# Patient Record
Sex: Female | Born: 1955 | Race: White | Hispanic: No | Marital: Married | Smoking: Never smoker
Health system: Southern US, Community
[De-identification: ages and names within clinical notes are randomized; demographics above are authoritative.]

## PROBLEM LIST (undated history)

## (undated) DIAGNOSIS — M1990 Unspecified osteoarthritis, unspecified site: Secondary | ICD-10-CM

## (undated) DIAGNOSIS — T8859XA Other complications of anesthesia, initial encounter: Secondary | ICD-10-CM

## (undated) DIAGNOSIS — I10 Essential (primary) hypertension: Secondary | ICD-10-CM

## (undated) HISTORY — PX: DILATION AND CURETTAGE OF UTERUS: SHX78

## (undated) HISTORY — PX: BREAST CYST EXCISION: SHX579

## (undated) HISTORY — PX: HERNIA REPAIR: SHX51

## (undated) HISTORY — PX: BREAST CYST ASPIRATION: SHX578

## (undated) HISTORY — PX: OTHER SURGICAL HISTORY: SHX169

---

## 2001-07-29 ENCOUNTER — Ambulatory Visit (HOSPITAL_COMMUNITY): Admission: RE | Admit: 2001-07-29 | Discharge: 2001-07-29 | Payer: Self-pay | Admitting: Sports Medicine

## 2001-07-29 ENCOUNTER — Encounter: Payer: Self-pay | Admitting: Sports Medicine

## 2005-05-21 ENCOUNTER — Encounter: Admission: RE | Admit: 2005-05-21 | Discharge: 2005-05-21 | Payer: Self-pay | Admitting: Otolaryngology

## 2006-07-16 ENCOUNTER — Ambulatory Visit: Payer: Self-pay | Admitting: Sports Medicine

## 2006-07-16 DIAGNOSIS — IMO0002 Reserved for concepts with insufficient information to code with codable children: Secondary | ICD-10-CM | POA: Insufficient documentation

## 2006-07-17 ENCOUNTER — Emergency Department (HOSPITAL_COMMUNITY): Admission: EM | Admit: 2006-07-17 | Discharge: 2006-07-17 | Payer: Self-pay | Admitting: Emergency Medicine

## 2006-11-12 ENCOUNTER — Ambulatory Visit: Payer: Self-pay | Admitting: Sports Medicine

## 2006-11-12 DIAGNOSIS — M79609 Pain in unspecified limb: Secondary | ICD-10-CM | POA: Insufficient documentation

## 2006-11-12 DIAGNOSIS — M21619 Bunion of unspecified foot: Secondary | ICD-10-CM | POA: Insufficient documentation

## 2010-12-22 ENCOUNTER — Ambulatory Visit
Admission: RE | Admit: 2010-12-22 | Discharge: 2010-12-22 | Disposition: A | Discharge status: Home / Self Care | Payer: BC Managed Care – PPO | Source: Ambulatory Visit | Attending: Family Medicine | Admitting: Family Medicine

## 2010-12-22 ENCOUNTER — Other Ambulatory Visit: Payer: Self-pay | Admitting: Family Medicine

## 2010-12-22 DIAGNOSIS — M545 Low back pain, unspecified: Secondary | ICD-10-CM

## 2011-11-14 ENCOUNTER — Other Ambulatory Visit: Payer: Self-pay | Admitting: Obstetrics and Gynecology

## 2011-11-14 DIAGNOSIS — R928 Other abnormal and inconclusive findings on diagnostic imaging of breast: Secondary | ICD-10-CM

## 2011-11-21 ENCOUNTER — Other Ambulatory Visit: Payer: BC Managed Care – PPO

## 2011-11-30 ENCOUNTER — Other Ambulatory Visit: Payer: BC Managed Care – PPO

## 2011-11-30 ENCOUNTER — Other Ambulatory Visit: Payer: Self-pay | Admitting: Obstetrics and Gynecology

## 2011-11-30 ENCOUNTER — Ambulatory Visit
Admission: RE | Admit: 2011-11-30 | Discharge: 2011-11-30 | Disposition: A | Discharge status: Home / Self Care | Payer: BC Managed Care – PPO | Source: Ambulatory Visit | Attending: Obstetrics and Gynecology | Admitting: Obstetrics and Gynecology

## 2011-11-30 DIAGNOSIS — R928 Other abnormal and inconclusive findings on diagnostic imaging of breast: Secondary | ICD-10-CM

## 2011-11-30 DIAGNOSIS — N6310 Unspecified lump in the right breast, unspecified quadrant: Secondary | ICD-10-CM

## 2011-12-07 ENCOUNTER — Ambulatory Visit
Admission: RE | Admit: 2011-12-07 | Discharge: 2011-12-07 | Disposition: A | Discharge status: Home / Self Care | Payer: BC Managed Care – PPO | Source: Ambulatory Visit | Attending: Obstetrics and Gynecology | Admitting: Obstetrics and Gynecology

## 2011-12-07 DIAGNOSIS — N6310 Unspecified lump in the right breast, unspecified quadrant: Secondary | ICD-10-CM

## 2011-12-14 ENCOUNTER — Other Ambulatory Visit: Payer: Self-pay | Admitting: Obstetrics and Gynecology

## 2011-12-14 DIAGNOSIS — Z803 Family history of malignant neoplasm of breast: Secondary | ICD-10-CM

## 2011-12-24 ENCOUNTER — Other Ambulatory Visit: Payer: BC Managed Care – PPO

## 2012-02-08 ENCOUNTER — Other Ambulatory Visit: Payer: Self-pay | Admitting: Family Medicine

## 2012-02-08 ENCOUNTER — Ambulatory Visit
Admission: RE | Admit: 2012-02-08 | Discharge: 2012-02-08 | Disposition: A | Discharge status: Home / Self Care | Payer: BC Managed Care – PPO | Source: Ambulatory Visit | Attending: Family Medicine | Admitting: Family Medicine

## 2012-02-08 DIAGNOSIS — R05 Cough: Secondary | ICD-10-CM

## 2012-02-08 DIAGNOSIS — R5383 Other fatigue: Secondary | ICD-10-CM

## 2012-02-08 DIAGNOSIS — R059 Cough, unspecified: Secondary | ICD-10-CM

## 2012-09-09 ENCOUNTER — Other Ambulatory Visit: Payer: Self-pay | Admitting: Obstetrics and Gynecology

## 2012-09-09 DIAGNOSIS — Z803 Family history of malignant neoplasm of breast: Secondary | ICD-10-CM

## 2012-09-09 DIAGNOSIS — N6310 Unspecified lump in the right breast, unspecified quadrant: Secondary | ICD-10-CM

## 2012-09-23 ENCOUNTER — Ambulatory Visit
Admission: RE | Admit: 2012-09-23 | Discharge: 2012-09-23 | Disposition: A | Discharge status: Home / Self Care | Payer: BC Managed Care – PPO | Source: Ambulatory Visit | Attending: Obstetrics and Gynecology | Admitting: Obstetrics and Gynecology

## 2012-09-23 ENCOUNTER — Other Ambulatory Visit: Payer: Self-pay | Admitting: Obstetrics and Gynecology

## 2012-09-23 DIAGNOSIS — N6310 Unspecified lump in the right breast, unspecified quadrant: Secondary | ICD-10-CM

## 2012-12-15 ENCOUNTER — Other Ambulatory Visit: Payer: Self-pay | Admitting: Obstetrics and Gynecology

## 2012-12-15 DIAGNOSIS — N6489 Other specified disorders of breast: Secondary | ICD-10-CM

## 2013-01-06 ENCOUNTER — Ambulatory Visit
Admission: RE | Admit: 2013-01-06 | Discharge: 2013-01-06 | Disposition: A | Discharge status: Home / Self Care | Payer: BC Managed Care – PPO | Source: Ambulatory Visit | Attending: Obstetrics and Gynecology | Admitting: Obstetrics and Gynecology

## 2013-01-06 DIAGNOSIS — N6489 Other specified disorders of breast: Secondary | ICD-10-CM

## 2013-01-06 DIAGNOSIS — Z803 Family history of malignant neoplasm of breast: Secondary | ICD-10-CM

## 2013-01-06 MED ORDER — GADOBENATE DIMEGLUMINE 529 MG/ML IV SOLN
15.0000 mL | Freq: Once | INTRAVENOUS | Status: AC | PRN
  Administered 2013-01-06: 15 mL via INTRAVENOUS

## 2014-05-27 ENCOUNTER — Other Ambulatory Visit: Payer: Self-pay | Admitting: Obstetrics and Gynecology

## 2014-05-27 DIAGNOSIS — N6310 Unspecified lump in the right breast, unspecified quadrant: Secondary | ICD-10-CM

## 2014-06-01 ENCOUNTER — Ambulatory Visit
Admission: RE | Admit: 2014-06-01 | Discharge: 2014-06-01 | Disposition: A | Discharge status: Home / Self Care | Payer: BLUE CROSS/BLUE SHIELD | Source: Ambulatory Visit | Attending: Obstetrics and Gynecology | Admitting: Obstetrics and Gynecology

## 2014-06-01 DIAGNOSIS — N6310 Unspecified lump in the right breast, unspecified quadrant: Secondary | ICD-10-CM

## 2014-06-08 ENCOUNTER — Encounter: Payer: Self-pay | Admitting: Podiatry

## 2014-06-11 ENCOUNTER — Ambulatory Visit (INDEPENDENT_AMBULATORY_CARE_PROVIDER_SITE_OTHER): Payer: BLUE CROSS/BLUE SHIELD

## 2014-06-11 ENCOUNTER — Ambulatory Visit (INDEPENDENT_AMBULATORY_CARE_PROVIDER_SITE_OTHER): Payer: BLUE CROSS/BLUE SHIELD | Admitting: Podiatry

## 2014-06-11 DIAGNOSIS — M25571 Pain in right ankle and joints of right foot: Secondary | ICD-10-CM

## 2014-06-11 DIAGNOSIS — M25471 Effusion, right ankle: Secondary | ICD-10-CM

## 2014-06-11 DIAGNOSIS — M79671 Pain in right foot: Secondary | ICD-10-CM

## 2014-06-11 NOTE — Progress Notes (Signed)
   Subjective:    Patient ID: Kathleen Meyers, female    DOB: [birth year], 59 y.o.   MRN: [id number]  HPI 59 year old female presents to the office today with complaints of right ankle swelling which has been ongoing for over 1 week. She denies any pain associated with the swelling or any redness. She denies any history of injury or trauma. She has no pain with ambulation. She does state that couple weeks has she had some bruising to her left foot as well as to her breast with no appear to cause. She did see her primary care physician a time for this. She has been wearing the ankle brace intermittently to the right ankle as well as a compression sock for swelling. No other complaints at this time. She has recently started taking lisinopril.   Review of Systems  All other systems reviewed and are negative.      Objective:   Physical Exam AAO x3, NAD DP/PT pulses palpable bilaterally, CRT less than 3 seconds Protective sensation intact with Simms Weinstein monofilament, vibratory sensation intact, Achilles tendon reflex intact There is edema overlying the dorsal lateral aspect of the right ankle. There is mild tenderness to palpation over the course the ATFL however there is no tenderness with CFL or PTFL. There is no areas of pinpoint bony tenderness or pain the vibratory sensation over the fibula, tibia, foot. There is no associated erythema or increase in warmth over the swelling. No pain on the syndesmosis of the peroneal tendons. Negative anterior drawer and talar tilt test.  No other areas of tenderness to bilateral lower extremities. MMT 5/5, ROM WNL.  No open lesions or pre-ulcerative lesions.  No overlying edema, erythema, increase in warmth to bilateral lower extremities.  No pain with calf compression, swelling, warmth, erythema bilaterally.      Assessment & Plan:  59 year old female with right ankle swelling -X-rays were obtained and reviewed with the patient. -Treatment  options were discussed including alternatives, risks, complications. -Recommended continued ankle brace and/or compression anklet to help with swelling. -Recommended her to follow-up with her primary care physician as well as she's had increased symptoms after starting new blood pressure medication. -If she is able to tolerate anti-inflammatories she can continue with this. -Follow-up if symptoms are not resolved within 4 weeks or sooner if any problems are to arise. In the meantime call the office with any questions, concerns, change in symptoms. -

## 2014-07-22 ENCOUNTER — Encounter (HOSPITAL_COMMUNITY): Payer: Self-pay

## 2014-07-22 ENCOUNTER — Encounter (HOSPITAL_COMMUNITY)
Admission: RE | Admit: 2014-07-22 | Discharge: 2014-07-22 | Disposition: A | Discharge status: Home / Self Care | Payer: BLUE CROSS/BLUE SHIELD | Source: Ambulatory Visit | Attending: Obstetrics and Gynecology | Admitting: Obstetrics and Gynecology

## 2014-07-22 ENCOUNTER — Other Ambulatory Visit: Payer: Self-pay

## 2014-07-22 DIAGNOSIS — Z01818 Encounter for other preprocedural examination: Secondary | ICD-10-CM | POA: Insufficient documentation

## 2014-07-22 LAB — CBC
HEMATOCRIT: 38.6 % (ref 36.0–46.0)
HEMOGLOBIN: 13.4 g/dL (ref 12.0–15.0)
MCH: 28.4 pg (ref 26.0–34.0)
MCHC: 34.7 g/dL (ref 30.0–36.0)
MCV: 81.8 fL (ref 78.0–100.0)
PLATELETS: 238 10*3/uL (ref 150–400)
RBC: 4.72 MIL/uL (ref 3.87–5.11)
RDW: 12.4 % (ref 11.5–15.5)
WBC: 7.9 10*3/uL (ref 4.0–10.5)

## 2014-07-22 LAB — BASIC METABOLIC PANEL
Anion gap: 5 (ref 5–15)
BUN: 25 mg/dL — ABNORMAL HIGH (ref 6–20)
CHLORIDE: 106 mmol/L (ref 101–111)
CO2: 26 mmol/L (ref 22–32)
Calcium: 9.4 mg/dL (ref 8.9–10.3)
Creatinine, Ser: 0.75 mg/dL (ref 0.44–1.00)
GFR calc non Af Amer: >60 mL/min (ref >60)
GLUCOSE: 98 mg/dL (ref 65–99)
POTASSIUM: 3.9 mmol/L (ref 3.5–5.1)
Sodium: 137 mmol/L (ref 135–145)

## 2014-07-22 NOTE — Patient Instructions (Signed)
Your procedure is scheduled on:  August 05, 2014  Enter through the Main Entrance of [facility] at:  0600 am   Pick up the phone at the desk and dial [phone].  Call this number if you have problems the morning of surgery: [phone].  Remember: Do NOT eat food: after midnight on August 04, 2014 Do NOT drink clear liquids after:  After midnight on August 04, 2014 Take these medicines the morning of surgery with a SIP OF WATER:  None   Do NOT wear jewelry (body piercing), metal hair clips/bobby pins, make-up, or nail polish. Do NOT wear lotions, powders, or perfumes.  You may wear deoderant. Do NOT shave for 48 hours prior to surgery. Do NOT bring valuables to the hospital. Contacts, dentures, or bridgework may not be worn into surgery. Leave suitcase in car.  After surgery it may be brought to your room.  For patients admitted to the hospital, checkout time is 11:00 AM the day of discharge.

## 2014-07-26 NOTE — H&P (Addendum)
Kathleen Meyers is an 59 y.o. female with enlarging fibroids.  Pt with occasional back pain.  Pt sister BRCA +.  No PMB     Menstrual History: No LMP recorded. Patient is postmenopausal.    No past medical history on file.  Past Surgical History  Procedure Laterality Date  . Ent surgery as a child     . Infertility surgery     . Dilation and curettage of uterus    . Hernia repair      umbilical as a child     No family history on file.  Social History:  reports that she has never smoked. She has never used smokeless tobacco. She reports that she does not drink alcohol or use illicit drugs.  Allergies: No Known Allergies  No prescriptions prior to admission    ROS  AF, VSS  Physical Exam  Gen - NAD Abd - soft, NT/ND CV - RRR Lungs - clear Ext - NT PV - uterus 8 wk, NT.  No palpable adnexal masses  [place]:  Multiple fibroids, largest 4.5cm.  1.6cm ovarian cyst w/ small hypoechoic area.  No free fluid  Assessment/Plan: Enlarging fibroids, pt sister w/ BRCA + LAVH/BSO, possible TAH R/b/a discussed, questions answered, informed consent  [name] 07/26/2014, 1:12 PM

## 2014-07-29 NOTE — Anesthesia Preprocedure Evaluation (Addendum)
Anesthesia Evaluation  Patient identified by MRN, date of birth, ID band Patient awake    Reviewed: Allergy & Precautions, NPO status , Patient's Chart, lab work & pertinent test results, reviewed documented beta blocker date and time   Airway Mallampati: II   Neck ROM: Full    Dental  (+) Teeth Intact, Dental Advisory Given   Pulmonary neg pulmonary ROS,  breath sounds clear to auscultation        Cardiovascular negative cardio ROS  Rhythm:Regular     Neuro/Psych negative neurological ROS  negative psych ROS   GI/Hepatic negative GI ROS, Neg liver ROS,   Endo/Other  negative endocrine ROS  Renal/GU negative Renal ROS   Fibroids    Musculoskeletal   Abdominal (+)  Abdomen: soft.    Peds  Hematology 13/38   Anesthesia Other Findings   Reproductive/Obstetrics                            Anesthesia Physical Anesthesia Plan  ASA: I  Anesthesia Plan: General   Post-op Pain Management:    Induction: Intravenous  Airway Management Planned: Oral ETT  Additional Equipment:   Intra-op Plan:   Post-operative Plan: Extubation in OR  Informed Consent: I have reviewed the patients History and Physical, chart, labs and discussed the procedure including the risks, benefits and alternatives for the proposed anesthesia with the patient or authorized representative who has indicated his/her understanding and acceptance.     Plan Discussed with:   Anesthesia Plan Comments:         Anesthesia Quick Evaluation

## 2014-08-04 MED ORDER — DEXTROSE 5 % IV SOLN
2.0000 g | INTRAVENOUS | Status: AC
  Administered 2014-08-05: 2 g via INTRAVENOUS
  Filled 2014-08-04: qty 2

## 2014-08-05 ENCOUNTER — Observation Stay (HOSPITAL_COMMUNITY)
Admission: RE | Admit: 2014-08-05 | Discharge: 2014-08-06 | Disposition: A | Discharge status: Home / Self Care | Payer: BLUE CROSS/BLUE SHIELD | Source: Ambulatory Visit | Attending: Obstetrics and Gynecology | Admitting: Obstetrics and Gynecology

## 2014-08-05 ENCOUNTER — Ambulatory Visit (HOSPITAL_COMMUNITY): Payer: BLUE CROSS/BLUE SHIELD | Admitting: Anesthesiology

## 2014-08-05 ENCOUNTER — Encounter (HOSPITAL_COMMUNITY): Payer: Self-pay

## 2014-08-05 ENCOUNTER — Encounter (HOSPITAL_COMMUNITY)
Admission: RE | Disposition: A | Discharge status: Home / Self Care | Payer: Self-pay | Source: Ambulatory Visit | Attending: Obstetrics and Gynecology

## 2014-08-05 DIAGNOSIS — D219 Benign neoplasm of connective and other soft tissue, unspecified: Secondary | ICD-10-CM | POA: Diagnosis present

## 2014-08-05 DIAGNOSIS — N8320 Unspecified ovarian cysts: Secondary | ICD-10-CM | POA: Insufficient documentation

## 2014-08-05 DIAGNOSIS — N840 Polyp of corpus uteri: Secondary | ICD-10-CM | POA: Diagnosis not present

## 2014-08-05 DIAGNOSIS — D259 Leiomyoma of uterus, unspecified: Principal | ICD-10-CM | POA: Insufficient documentation

## 2014-08-05 HISTORY — PX: LAPAROSCOPIC ASSISTED VAGINAL HYSTERECTOMY: SHX5398

## 2014-08-05 HISTORY — PX: SALPINGOOPHORECTOMY: SHX82

## 2014-08-05 LAB — TYPE AND SCREEN
ABO/RH(D): O POS
Antibody Screen: NEGATIVE

## 2014-08-05 LAB — ABO/RH: ABO/RH(D): O POS

## 2014-08-05 SURGERY — HYSTERECTOMY, VAGINAL, LAPAROSCOPY-ASSISTED
Anesthesia: General

## 2014-08-05 MED ORDER — FENTANYL CITRATE (PF) 100 MCG/2ML IJ SOLN
INTRAMUSCULAR | Status: DC | PRN
  Administered 2014-08-05: 50 ug via INTRAVENOUS
  Administered 2014-08-05 (×2): 100 ug via INTRAVENOUS
  Administered 2014-08-05 (×2): 50 ug via INTRAVENOUS

## 2014-08-05 MED ORDER — MIDAZOLAM HCL 2 MG/2ML IJ SOLN
INTRAMUSCULAR | Status: AC
  Filled 2014-08-05: qty 2

## 2014-08-05 MED ORDER — BUPIVACAINE HCL (PF) 0.25 % IJ SOLN
INTRAMUSCULAR | Status: DC | PRN
Start: 2014-08-05 — End: 2014-08-05
  Administered 2014-08-05: 5 mL

## 2014-08-05 MED ORDER — LACTATED RINGERS IV SOLN
INTRAVENOUS | Status: DC
  Administered 2014-08-05: 08:00:00 via INTRAVENOUS

## 2014-08-05 MED ORDER — NALOXONE HCL 0.4 MG/ML IJ SOLN: 0.4000 mg | INTRAMUSCULAR | Status: DC | PRN

## 2014-08-05 MED ORDER — SODIUM CHLORIDE 0.9 % IJ SOLN
9.0000 mL | INTRAMUSCULAR | Status: DC | PRN
Start: 2014-08-05 — End: 2014-08-06

## 2014-08-05 MED ORDER — HYDROMORPHONE HCL 1 MG/ML IJ SOLN
INTRAMUSCULAR | Status: AC
  Administered 2014-08-05: 0.5 mg via INTRAVENOUS
  Filled 2014-08-05: qty 1

## 2014-08-05 MED ORDER — DIPHENHYDRAMINE HCL 50 MG/ML IJ SOLN: 12.5000 mg | Freq: Four times a day (QID) | INTRAMUSCULAR | Status: DC | PRN

## 2014-08-05 MED ORDER — MENTHOL 3 MG MT LOZG: 1.0000 | LOZENGE | OROMUCOSAL | Status: DC | PRN

## 2014-08-05 MED ORDER — KETOROLAC TROMETHAMINE 30 MG/ML IJ SOLN
30.0000 mg | Freq: Three times a day (TID) | INTRAMUSCULAR | Status: DC
  Administered 2014-08-05 – 2014-08-06 (×3): 30 mg via INTRAVENOUS
  Filled 2014-08-05 (×3): qty 1

## 2014-08-05 MED ORDER — PROMETHAZINE HCL 25 MG/ML IJ SOLN: 6.2500 mg | INTRAMUSCULAR | Status: DC | PRN

## 2014-08-05 MED ORDER — ONDANSETRON HCL 4 MG/2ML IJ SOLN
INTRAMUSCULAR | Status: DC | PRN
  Administered 2014-08-05: 4 mg via INTRAVENOUS

## 2014-08-05 MED ORDER — GLYCOPYRROLATE 0.2 MG/ML IJ SOLN
INTRAMUSCULAR | Status: AC
  Filled 2014-08-05: qty 3

## 2014-08-05 MED ORDER — SCOPOLAMINE 1 MG/3DAYS TD PT72
MEDICATED_PATCH | TRANSDERMAL | Status: AC
  Filled 2014-08-05: qty 1

## 2014-08-05 MED ORDER — LIDOCAINE HCL (CARDIAC) 20 MG/ML IV SOLN
INTRAVENOUS | Status: DC | PRN
  Administered 2014-08-05: 100 mg via INTRAVENOUS

## 2014-08-05 MED ORDER — GLYCOPYRROLATE 0.2 MG/ML IJ SOLN
INTRAMUSCULAR | Status: DC | PRN
  Administered 2014-08-05: 0.6 mg via INTRAVENOUS

## 2014-08-05 MED ORDER — ONDANSETRON HCL 4 MG PO TABS: 4.0000 mg | ORAL_TABLET | Freq: Four times a day (QID) | ORAL | Status: DC | PRN

## 2014-08-05 MED ORDER — NEOSTIGMINE METHYLSULFATE 10 MG/10ML IV SOLN
INTRAVENOUS | Status: DC | PRN
  Administered 2014-08-05: 3 mg via INTRAVENOUS

## 2014-08-05 MED ORDER — KETOROLAC TROMETHAMINE 30 MG/ML IJ SOLN
INTRAMUSCULAR | Status: DC | PRN
  Administered 2014-08-05: 30 mg via INTRAVENOUS

## 2014-08-05 MED ORDER — MORPHINE SULFATE 1 MG/ML IV SOLN
INTRAVENOUS | Status: DC
  Administered 2014-08-05: 13:00:00 via INTRAVENOUS
  Administered 2014-08-05: 14 mg via INTRAVENOUS
  Administered 2014-08-06 (×2): 3 mg via INTRAVENOUS
  Filled 2014-08-05: qty 25

## 2014-08-05 MED ORDER — FENTANYL CITRATE (PF) 100 MCG/2ML IJ SOLN
INTRAMUSCULAR | Status: AC
  Administered 2014-08-05: 25 ug via INTRAVENOUS
  Filled 2014-08-05: qty 2

## 2014-08-05 MED ORDER — ONDANSETRON HCL 4 MG/2ML IJ SOLN: 4.0000 mg | Freq: Four times a day (QID) | INTRAMUSCULAR | Status: DC | PRN

## 2014-08-05 MED ORDER — MIDAZOLAM HCL 2 MG/2ML IJ SOLN
INTRAMUSCULAR | Status: DC | PRN
  Administered 2014-08-05: 2 mg via INTRAVENOUS

## 2014-08-05 MED ORDER — ATORVASTATIN CALCIUM 20 MG PO TABS
20.0000 mg | ORAL_TABLET | Freq: Every day | ORAL | Status: DC
  Filled 2014-08-05 (×2): qty 1

## 2014-08-05 MED ORDER — BUPIVACAINE HCL (PF) 0.25 % IJ SOLN
INTRAMUSCULAR | Status: AC
  Filled 2014-08-05: qty 30

## 2014-08-05 MED ORDER — HYDROMORPHONE HCL 1 MG/ML IJ SOLN
0.5000 mg | INTRAMUSCULAR | Status: AC | PRN
  Administered 2014-08-05 (×4): 0.5 mg via INTRAVENOUS

## 2014-08-05 MED ORDER — OXYCODONE-ACETAMINOPHEN 5-325 MG PO TABS
1.0000 | ORAL_TABLET | ORAL | Status: DC | PRN
  Administered 2014-08-06: 1 via ORAL
  Filled 2014-08-05: qty 2

## 2014-08-05 MED ORDER — KETOROLAC TROMETHAMINE 30 MG/ML IJ SOLN
INTRAMUSCULAR | Status: AC
  Administered 2014-08-05: 30 mg via INTRAMUSCULAR
  Filled 2014-08-05: qty 1

## 2014-08-05 MED ORDER — FENTANYL CITRATE (PF) 100 MCG/2ML IJ SOLN
25.0000 ug | INTRAMUSCULAR | Status: DC | PRN
  Administered 2014-08-05: 25 ug via INTRAVENOUS

## 2014-08-05 MED ORDER — PROPOFOL 10 MG/ML IV BOLUS
INTRAVENOUS | Status: AC
  Filled 2014-08-05: qty 20

## 2014-08-05 MED ORDER — MEPERIDINE HCL 25 MG/ML IJ SOLN: 6.2500 mg | INTRAMUSCULAR | Status: DC | PRN

## 2014-08-05 MED ORDER — AMLODIPINE BESYLATE 5 MG PO TABS
5.0000 mg | ORAL_TABLET | Freq: Every day | ORAL | Status: DC
  Filled 2014-08-05 (×2): qty 1

## 2014-08-05 MED ORDER — ROCURONIUM BROMIDE 100 MG/10ML IV SOLN
INTRAVENOUS | Status: AC
  Filled 2014-08-05: qty 1

## 2014-08-05 MED ORDER — HYDROMORPHONE HCL 1 MG/ML IJ SOLN
INTRAMUSCULAR | Status: DC | PRN
  Administered 2014-08-05: 1 mg via INTRAVENOUS

## 2014-08-05 MED ORDER — FENTANYL CITRATE (PF) 250 MCG/5ML IJ SOLN
INTRAMUSCULAR | Status: AC
  Filled 2014-08-05: qty 5

## 2014-08-05 MED ORDER — HYDROMORPHONE HCL 1 MG/ML IJ SOLN
INTRAMUSCULAR | Status: AC
  Filled 2014-08-05: qty 1

## 2014-08-05 MED ORDER — 0.9 % SODIUM CHLORIDE (POUR BTL) OPTIME
TOPICAL | Status: DC | PRN
  Administered 2014-08-05: 1000 mL

## 2014-08-05 MED ORDER — PROPOFOL 10 MG/ML IV BOLUS
INTRAVENOUS | Status: DC | PRN
  Administered 2014-08-05: 200 mg via INTRAVENOUS

## 2014-08-05 MED ORDER — DIPHENHYDRAMINE HCL 12.5 MG/5ML PO ELIX: 12.5000 mg | ORAL_SOLUTION | Freq: Four times a day (QID) | ORAL | Status: DC | PRN

## 2014-08-05 MED ORDER — NEOSTIGMINE METHYLSULFATE 10 MG/10ML IV SOLN
INTRAVENOUS | Status: AC
  Filled 2014-08-05: qty 1

## 2014-08-05 MED ORDER — ONDANSETRON HCL 4 MG/2ML IJ SOLN
INTRAMUSCULAR | Status: AC
  Filled 2014-08-05: qty 2

## 2014-08-05 MED ORDER — DEXAMETHASONE SODIUM PHOSPHATE 4 MG/ML IJ SOLN
INTRAMUSCULAR | Status: AC
  Filled 2014-08-05: qty 1

## 2014-08-05 MED ORDER — HYDROMORPHONE HCL 1 MG/ML IJ SOLN
1.0000 mg | INTRAMUSCULAR | Status: DC | PRN
  Administered 2014-08-05: 1 mg via INTRAVENOUS
  Filled 2014-08-05: qty 1

## 2014-08-05 MED ORDER — SCOPOLAMINE 1 MG/3DAYS TD PT72: 1.0000 | MEDICATED_PATCH | Freq: Once | TRANSDERMAL | Status: DC

## 2014-08-05 MED ORDER — ROCURONIUM BROMIDE 100 MG/10ML IV SOLN
INTRAVENOUS | Status: DC | PRN
  Administered 2014-08-05: 10 mg via INTRAVENOUS
  Administered 2014-08-05: 50 mg via INTRAVENOUS

## 2014-08-05 MED ORDER — DEXAMETHASONE SODIUM PHOSPHATE 10 MG/ML IJ SOLN
INTRAMUSCULAR | Status: DC | PRN
  Administered 2014-08-05: 4 mg via INTRAVENOUS

## 2014-08-05 MED ORDER — IBUPROFEN 600 MG PO TABS
600.0000 mg | ORAL_TABLET | Freq: Four times a day (QID) | ORAL | Status: DC | PRN
  Administered 2014-08-06: 600 mg via ORAL
  Filled 2014-08-05: qty 1

## 2014-08-05 MED ORDER — DEXTROSE IN LACTATED RINGERS 5 % IV SOLN
INTRAVENOUS | Status: DC
  Administered 2014-08-05: 21:00:00 via INTRAVENOUS

## 2014-08-05 MED ORDER — KETOROLAC TROMETHAMINE 30 MG/ML IJ SOLN
INTRAMUSCULAR | Status: AC
  Filled 2014-08-05: qty 1

## 2014-08-05 MED ORDER — LIDOCAINE HCL (CARDIAC) 20 MG/ML IV SOLN
INTRAVENOUS | Status: AC
  Filled 2014-08-05: qty 5

## 2014-08-05 MED ORDER — ACETAMINOPHEN 10 MG/ML IV SOLN
1000.0000 mg | Freq: Once | INTRAVENOUS | Status: AC
Start: 2014-08-05 — End: 2014-08-05
  Administered 2014-08-05: 1000 mg via INTRAVENOUS
  Filled 2014-08-05: qty 100

## 2014-08-05 MED ORDER — ONDANSETRON HCL 4 MG/2ML IJ SOLN
4.0000 mg | Freq: Four times a day (QID) | INTRAMUSCULAR | Status: DC | PRN
Start: 2014-08-05 — End: 2014-08-06

## 2014-08-05 MED ORDER — KETOROLAC TROMETHAMINE 30 MG/ML IJ SOLN
30.0000 mg | Freq: Once | INTRAMUSCULAR | Status: AC
  Administered 2014-08-05: 30 mg via INTRAMUSCULAR

## 2014-08-05 SURGICAL SUPPLY — 56 items
CABLE HIGH FREQUENCY MONO STRZ (ELECTRODE) IMPLANT
CANISTER SUCT 3000ML (MISCELLANEOUS) ×3 IMPLANT
CLOTH BEACON ORANGE TIMEOUT ST (SAFETY) ×3 IMPLANT
CONT PATH 16OZ SNAP LID 3702 (MISCELLANEOUS) ×3 IMPLANT
COVER BACK TABLE 60X90IN (DRAPES) ×3 IMPLANT
DECANTER SPIKE VIAL GLASS SM (MISCELLANEOUS) IMPLANT
DRAPE CESAREAN BIRTH W POUCH (DRAPES) ×3 IMPLANT
DRAPE WARM FLUID 44X44 (DRAPE) IMPLANT
DRSG COVADERM PLUS 2X2 (GAUZE/BANDAGES/DRESSINGS) ×6 IMPLANT
DRSG OPSITE POSTOP 3X4 (GAUZE/BANDAGES/DRESSINGS) IMPLANT
DRSG OPSITE POSTOP 4X10 (GAUZE/BANDAGES/DRESSINGS) ×3 IMPLANT
DURAPREP 26ML APPLICATOR (WOUND CARE) ×3 IMPLANT
ELECT LIGASURE LONG (ELECTRODE) IMPLANT
ELECT LIGASURE SHORT 9 REUSE (ELECTRODE) IMPLANT
ELECT REM PT RETURN 9FT ADLT (ELECTROSURGICAL)
ELECTRODE REM PT RTRN 9FT ADLT (ELECTROSURGICAL) IMPLANT
GAUZE SPONGE 4X4 16PLY XRAY LF (GAUZE/BANDAGES/DRESSINGS) IMPLANT
GLOVE BIO SURGEON STRL SZ 6.5 (GLOVE) ×3 IMPLANT
GLOVE BIOGEL PI IND STRL 6.5 (GLOVE) ×2 IMPLANT
GLOVE BIOGEL PI IND STRL 7.0 (GLOVE) ×6 IMPLANT
GLOVE BIOGEL PI INDICATOR 6.5 (GLOVE) ×1
GLOVE BIOGEL PI INDICATOR 7.0 (GLOVE) ×3
GOWN STRL REUS W/TWL LRG LVL3 (GOWN DISPOSABLE) ×9 IMPLANT
HEMOSTAT SURGICEL 4X8 (HEMOSTASIS) IMPLANT
LIQUID BAND (GAUZE/BANDAGES/DRESSINGS) ×6 IMPLANT
NS IRRIG 1000ML POUR BTL (IV SOLUTION) ×3 IMPLANT
PACK ABDOMINAL GYN (CUSTOM PROCEDURE TRAY) ×3 IMPLANT
PACK LAVH (CUSTOM PROCEDURE TRAY) ×3 IMPLANT
PACK ROBOTIC GOWN (GOWN DISPOSABLE) ×3 IMPLANT
PAD OB MATERNITY 4.3X12.25 (PERSONAL CARE ITEMS) ×3 IMPLANT
PAD POSITIONER PINK NONSTERILE (MISCELLANEOUS) ×3 IMPLANT
PROTECTOR NERVE ULNAR (MISCELLANEOUS) ×3 IMPLANT
SEALER TISSUE G2 CVD JAW 45CM (ENDOMECHANICALS) ×3 IMPLANT
SET IRRIG TUBING LAPAROSCOPIC (IRRIGATION / IRRIGATOR) IMPLANT
SLEEVE XCEL OPT CAN 5 100 (ENDOMECHANICALS) IMPLANT
SPONGE LAP 18X18 X RAY DECT (DISPOSABLE) ×6 IMPLANT
STAPLER VISISTAT 35W (STAPLE) IMPLANT
SUT MNCRL 0 MO-4 VIOLET 18 CR (SUTURE) ×6 IMPLANT
SUT MNCRL 0 VIOLET 6X18 (SUTURE) ×2 IMPLANT
SUT MON AB 2-0 CT1 36 (SUTURE) ×3 IMPLANT
SUT MON AB-0 CT1 36 (SUTURE) ×3 IMPLANT
SUT MONOCRYL 0 6X18 (SUTURE) ×1
SUT MONOCRYL 0 MO 4 18  CR/8 (SUTURE) ×3
SUT PDS AB 0 CTX 60 (SUTURE) ×3 IMPLANT
SUT PLAIN 2 0 XLH (SUTURE) IMPLANT
SUT VIC AB 3-0 PS2 18 (SUTURE) ×1
SUT VIC AB 3-0 PS2 18XBRD (SUTURE) ×2 IMPLANT
SUT VIC AB 3-0 X1 27 (SUTURE) IMPLANT
SUT VIC AB 4-0 KS 27 (SUTURE) IMPLANT
SUT VICRYL 0 UR6 27IN ABS (SUTURE) ×3 IMPLANT
TOWEL OR 17X24 6PK STRL BLUE (TOWEL DISPOSABLE) ×6 IMPLANT
TRAY FOLEY CATH SILVER 14FR (SET/KITS/TRAYS/PACK) ×3 IMPLANT
TROCAR OPTI TIP 5M 100M (ENDOMECHANICALS) ×3 IMPLANT
TROCAR XCEL NON-BLD 11X100MML (ENDOMECHANICALS) ×3 IMPLANT
WARMER LAPAROSCOPE (MISCELLANEOUS) ×3 IMPLANT
WATER STERILE IRR 1000ML POUR (IV SOLUTION) ×3 IMPLANT

## 2014-08-05 NOTE — Anesthesia Postprocedure Evaluation (Signed)
  Anesthesia Post-op Note  Patient: Kathleen Meyers  Procedure(s) Performed: Procedure(s): LAPAROSCOPIC ASSISTED VAGINAL HYSTERECTOMY (N/A) BILATERAL SALPINGO OOPHORECTOMY (Bilateral)  Patient Location: [facility]  Anesthesia Type:General  Level of Consciousness: awake, alert  and oriented  Airway and Oxygen Therapy: Patient Spontanous Breathing and Patient connected to nasal cannula oxygen  Post-op Pain: none  Post-op Assessment: Post-op Vital signs reviewed, Patient's Cardiovascular Status Stable, Respiratory Function Stable, Adequate PO intake and Pain level controlled              Post-op Vital Signs: Reviewed and stable  Last Vitals:  Filed Vitals:   08/05/14 1430  BP: 123/79  Pulse: 61  Temp:   Resp: 9    Complications: No apparent anesthesia complications

## 2014-08-05 NOTE — Transfer of Care (Signed)
Immediate Anesthesia Transfer of Care Note  Patient: Kathleen Meyers  Procedure(s) Performed: Procedure(s): LAPAROSCOPIC ASSISTED VAGINAL HYSTERECTOMY (N/A) BILATERAL SALPINGO OOPHORECTOMY (Bilateral)  Patient Location: PACU  Anesthesia Type:General  Level of Consciousness: awake, alert  and oriented  Airway & Oxygen Therapy: Patient Spontanous Breathing and Patient connected to nasal cannula oxygen  Post-op Assessment: Report given to RN, Post -op Vital signs reviewed and stable and Patient moving all extremities  Post vital signs: Reviewed and stable  Last Vitals:  Filed Vitals:   08/05/14 0617  BP: 114/81  Pulse: 71  Temp: 36.9 C  Resp: 16    Complications: No apparent anesthesia complications

## 2014-08-05 NOTE — Addendum Note (Signed)
Addendum  created 08/05/14 1549 by Jonna Munro, CRNA   Modules edited: Notes Section   Notes Section:  File: [id number]

## 2014-08-05 NOTE — Anesthesia Procedure Notes (Signed)
Procedure Name: Intubation Date/Time: 08/05/2014 7:33 AM Performed by: Hewitt Blade Pre-anesthesia Checklist: Patient identified, Emergency Drugs available, Suction available and Patient being monitored Patient Re-evaluated:Patient Re-evaluated prior to inductionOxygen Delivery Method: Circle system utilized Preoxygenation: Pre-oxygenation with 100% oxygen Intubation Type: IV induction Ventilation: Mask ventilation without difficulty Laryngoscope Size: Mac and 3 Grade View: Grade II Tube type: Oral Tube size: 7.0 mm Number of attempts: 1 Airway Equipment and Method: Stylet Placement Confirmation: ETT inserted through vocal cords under direct vision,  positive ETCO2 and breath sounds checked- equal and bilateral Secured at: 20 cm Tube secured with: Tape Dental Injury: Teeth and Oropharynx as per pre-operative assessment

## 2014-08-05 NOTE — Op Note (Signed)
08/05/2014  9:21 AM  PATIENT:  Kathleen Meyers  60 y.o. female  PRE-OPERATIVE DIAGNOSIS:  FIBROIDS  POST-OPERATIVE DIAGNOSIS:  FIBROIDS  PROCEDURE:  Procedure(s): LAPAROSCOPIC ASSISTED VAGINAL HYSTERECTOMY (N/A) BILATERAL SALPINGO OOPHORECTOMY (Bilateral) POSSIBLE HYSTERECTOMY ABDOMINAL (N/A)  SURGEON:  Surgeon(s) and Role:    * Marylynn Pearson, MD - Primary      PHYSICIAN ASSISTANT: Dian Queen, MD  ASSISTANTS: none   ANESTHESIA:   general  EBL:  Total I/O In: 1500 [I.V.:1500] Out: 300 [Urine:150; Blood:150]  BLOOD ADMINISTERED:none  DRAINS: foley   LOCAL MEDICATIONS USED:  MARCAINE     SPECIMEN:  Source of Specimen:  uterus bilateral tubes and ovaries  DISPOSITION OF SPECIMEN:  PATHOLOGY  COUNTS:  YES  TOURNIQUET:  * No tourniquets in log *  DICTATION: .Other Dictation: Dictation Number pending  PLAN OF CARE: Admit for overnight observation  PATIENT DISPOSITION:  PACU - hemodynamically stable.   Delay start of Pharmacological VTE agent (>24hrs) due to surgical blood loss or risk of bleeding: no

## 2014-08-05 NOTE — Progress Notes (Addendum)
Day of Surgery Procedure(s) (LRB): LAPAROSCOPIC ASSISTED VAGINAL HYSTERECTOMY (N/A) BILATERAL SALPINGO OOPHORECTOMY (Bilateral)  Subjective: Patient reports incisional pain.  No n/v or VB.  Pain not controlled with IV dilaudid and torodol.  Rectal pressure has resolved  Objective: I have reviewed patient's vital signs, intake and output and medications.  General: alert GI: normal findings: soft, non-tender Extremities: extremities normal, atraumatic, no cyanosis or edema Vaginal Bleeding: none  Assessment: s/p Procedure(s): LAPAROSCOPIC ASSISTED VAGINAL HYSTERECTOMY (N/A) BILATERAL SALPINGO OOPHORECTOMY (Bilateral): stable  Plan: pain not well controlled.  Plan to start morphine PCA      Kathleen Meyers 08/05/2014, 1:22 PM

## 2014-08-05 NOTE — Progress Notes (Signed)
Day of Surgery Procedure(s) (LRB): LAPAROSCOPIC ASSISTED VAGINAL HYSTERECTOMY (N/A) BILATERAL SALPINGO OOPHORECTOMY (Bilateral)  Subjective: Patient reports incisional pain improved with morphine PCA.  No VB.  No n/v  Objective: I have reviewed patient's vital signs, intake and output and medications.  General: alert and cooperative GI: normal findings: softly distended.  inc c/d/i Vaginal Bleeding: none  Assessment: s/p Procedure(s): LAPAROSCOPIC ASSISTED VAGINAL HYSTERECTOMY (N/A) BILATERAL SALPINGO OOPHORECTOMY (Bilateral): stable  Plan: continue PCA.  Routine postop care     Liara Holm 08/05/2014, 6:07 PM

## 2014-08-05 NOTE — Anesthesia Postprocedure Evaluation (Signed)
  Anesthesia Post-op Note  Patient: Kathleen Meyers  Procedure(s) Performed: Procedure(s): LAPAROSCOPIC ASSISTED VAGINAL HYSTERECTOMY (N/A) BILATERAL SALPINGO OOPHORECTOMY (Bilateral)  Patient Location: PACU  Anesthesia Type:General  Level of Consciousness: awake, alert  and oriented  Airway and Oxygen Therapy: Patient Spontanous Breathing  Post-op Pain: mild  Post-op Assessment: Post-op Vital signs reviewed, Patient's Cardiovascular Status Stable, Respiratory Function Stable, Patent Airway and No signs of Nausea or vomiting              Post-op Vital Signs: Reviewed and stable  Last Vitals:  Filed Vitals:   08/05/14 1000  BP: 131/78  Pulse: 70  Temp:   Resp: 20    Complications: No apparent anesthesia complications

## 2014-08-06 DIAGNOSIS — D259 Leiomyoma of uterus, unspecified: Secondary | ICD-10-CM | POA: Diagnosis not present

## 2014-08-06 LAB — CBC
HEMATOCRIT: 33 % — AB (ref 36.0–46.0)
Hemoglobin: 11.2 g/dL — ABNORMAL LOW (ref 12.0–15.0)
MCH: 28.4 pg (ref 26.0–34.0)
MCHC: 33.9 g/dL (ref 30.0–36.0)
MCV: 83.5 fL (ref 78.0–100.0)
Platelets: 234 10*3/uL (ref 150–400)
RBC: 3.95 MIL/uL (ref 3.87–5.11)
RDW: 12.9 % (ref 11.5–15.5)
WBC: 8 10*3/uL (ref 4.0–10.5)

## 2014-08-06 MED ORDER — OXYCODONE-ACETAMINOPHEN 5-325 MG PO TABS: 1.0000 | ORAL_TABLET | ORAL | Status: DC | PRN

## 2014-08-06 MED ORDER — IBUPROFEN 600 MG PO TABS: 600.0000 mg | ORAL_TABLET | Freq: Four times a day (QID) | ORAL | Status: DC | PRN

## 2014-08-06 NOTE — Op Note (Signed)
NAMETERITA, HEJL               ACCOUNT NO.:  [id number]  MEDICAL RECORD NO.:  [id number]  LOCATION:  9302                          FACILITY:  [facility]  PHYSICIAN:  Marylynn Pearson, MD    DATE OF BIRTH:  02-15-1955  DATE OF PROCEDURE:  08/05/2014 DATE OF DISCHARGE:                              OPERATIVE REPORT   PREOPERATIVE DIAGNOSES: 1. Enlarging fibroids. 2. Back pain. 3. 1.6 cm ovarian cyst.  POSTOPERATIVE DIAGNOSES: 1. Enlarging fibroids. 2. Back pain. 3. 1.6 cm ovarian cyst.  PROCEDURE:  Laparoscopic-assisted vaginal hysterectomy with bilateral salpingo-oophorectomy.  SURGEON:  Marylynn Pearson, MD  ASSISTANT:  Erik Obey. Grewal, M.D.  BLOOD LOSS:  150 mL.  URINE OUTPUT:  150 mL, clear.  SPECIMENS:  Uterus, bilateral tubes, and ovaries.  COMPLICATIONS:  None.  CONDITION:  Stable to recovery room.  DESCRIPTION OF PROCEDURE:  The patient was taken to the operating room. After informed consent was obtained, she was given general anesthesia and placed in the dorsal lithotomy position using Allen stirrups.  She was prepped and draped in sterile fashion and a Foley catheter was inserted.  Bivalve speculum was placed in the vagina.  Single-tooth tenaculum was attached to the anterior lip of the cervix.  The Hulka clamp was placed.  Tenaculum and speculum were removed and our attention was turned to the abdomen.  Marcaine 0.25% was used to provide local anesthesia at the site of our infraumbilical and suprapubic incision.  Infraumbilical incision was made with a scalpel and extended bluntly to the level of the fascia using a Kelly clamp.  Optical trocar was inserted under direct visualization.  CO2 was turned on and a survey of the pelvis was performed.  Uterus was noted to be enlarged with multiple fibroids. Bilateral tubes and ovaries appeared normal and free of adhesions.  A suprapubic incision was made 2 fingerbreadths above the pubic symphysis and a 5-mm trocar  was inserted under direct visualization.  The uterus was manipulated anterior and to the patient's left, and the right adnexa was grasped up and towards the midline.  The Enseal device was used to clamp, cauterize, and cut the infundibulopelvic ligament.  This incision was extended along the broad ligament down to the level of the round ligament.  The round ligament was grasped, cauterized, and cut using the Enseal.  Excellent hemostasis was noted and our attention was turned to the left.  This procedure was repeated on the left side.  Excellent hemostasis was noted and our attention was then turned to the vagina. All instruments were removed from the trocars.  The CO2 was turned off. Hulka clamp was removed from the cervix.  The cervix was grasped with a toothed tenaculum and a circumferential incision was made using the Bovie.  Posterior cul-de-sac was entered sharply using curved Mayo scissors and a long weighted speculum was placed in the posterior cul-de-sac.  Bilateral uterosacral ligaments were clamped, cut, and suture ligated.  Hemostasis was noted.  The pubocervical fascia was dissected off the cervix anteriorly.  The uterine and cardinal ligaments were then clamped bilaterally, cut, and suture ligated.  The anterior cul-de-sac was entered sharply using Mayo scissors and a Deaver was placed  anteriorly.  Remaining pedicles were clamped, cut, and suture ligated, and the uterus was delivered through the vagina.  Pedicles were doubly ligated and noted to be hemostatic.  A moist lap sponge was then used to retract the bowel.  The posterior vaginal cuff was reapproximated using a running locked suture and the remainder of the vaginal cuff was closed vertically using a series of figure-of-eight stitches.  Hemostasis was noted.  Instruments were removed from the vagina and our attention was turned to the abdomen. CO2 was turned back on all pedicles and vaginal cuff were inspected  and noted to be hemostatic.  All trocars and instruments were then removed from the abdomen.  The fascia was reapproximated at the umbilical incision and both skin incisions were closed with Vicryl.  Dermabond was placed.  She was extubated and taken to the recovery room.  Sponge, lap, needle, and instrument counts were correct x2.     Marylynn Pearson, MD     GA/MEDQ  D:  08/05/2014  T:  08/06/2014  Job:  [id number]

## 2014-08-06 NOTE — Progress Notes (Signed)
Pt discharged to home with significant other.  Discharge instructions reviewed with patient and significant other together.  Condition stable.  Pt ambulated to car with Brynda Peon, RN.  No equipment for home ordered at discharge.

## 2014-08-06 NOTE — Progress Notes (Signed)
Pt doing well.  Ambulating.  No flatus yet.  + void after foley d/c.  AF, VSS Hgb stable Gen - NAD Abd - soft, NT Ext - no edema  A/P:  Plan to advance diet and try PO meds today

## 2014-08-09 ENCOUNTER — Encounter (HOSPITAL_COMMUNITY): Payer: Self-pay | Admitting: Obstetrics and Gynecology

## 2014-08-15 NOTE — Discharge Summary (Signed)
Physician Discharge Summary  Patient ID: Kathleen Meyers MRN: [id number] DOB/AGE: [birth year] 60 y.o.  Admit date: 08/05/2014 Discharge date: 08/15/2014  Admission Diagnoses:  Enlarging, symptomatic fibroids  Discharge Diagnoses:  Active Problems:   Fibroids   Discharged Condition: stable  Hospital Course: Pt admitted for postop care.  Pain was initially controlled with IV meds and advanced to PO as she was able to tolerate.  On POD 1, she was able to ambulate, void, & tolerate a regular diet.  Pain was controlled with PO meds.  Vital signs and labs were stable.  Consults: None  Significant Diagnostic Studies: labs: cbc  Treatments: surgery: LAVH  Discharge Exam: Blood pressure 111/70, pulse 60, temperature 98.1 F (36.7 C), temperature source Oral, resp. rate 16, height 5\' 3"  (1.6 m), weight 160 lb (72.576 kg), SpO2 97 %. General appearance: alert and cooperative Resp: clear to auscultation bilaterally Cardio: regular rate and rhythm GI: normal findings: soft, non-tender Incision/Wound:c/d/i  Disposition: 01-Home or Self Care     Medication List    TAKE these medications        amLODipine 5 MG tablet  Commonly known as:  NORVASC  Take 5 mg by mouth daily.     atorvastatin 20 MG tablet  Commonly known as:  LIPITOR  Take 20 mg by mouth daily.     ibuprofen 600 MG tablet  Commonly known as:  ADVIL,MOTRIN  Take 1 tablet (600 mg total) by mouth every 6 (six) hours as needed for moderate pain.     oxyCODONE-acetaminophen 5-325 MG per tablet  Commonly known as:  PERCOCET/ROXICET  Take 1-2 tablets by mouth every 4 (four) hours as needed for severe pain (moderate to severe pain (when tolerating fluids)).     therapeutic multivitamin-minerals tablet  Take 1 tablet by mouth daily.           Follow-up Information    Follow up In 2 weeks.      Signed: Hayde Kilgour 08/15/2014, 9:56 AM

## 2014-12-16 ENCOUNTER — Ambulatory Visit (INDEPENDENT_AMBULATORY_CARE_PROVIDER_SITE_OTHER): Payer: BLUE CROSS/BLUE SHIELD | Admitting: Podiatry

## 2014-12-16 ENCOUNTER — Encounter: Payer: Self-pay | Admitting: Podiatry

## 2014-12-16 VITALS — BP 140/72 | HR 75 | Resp 16

## 2014-12-16 DIAGNOSIS — M25471 Effusion, right ankle: Secondary | ICD-10-CM

## 2014-12-16 MED ORDER — OXYCODONE-ACETAMINOPHEN 5-325 MG PO TABS: 1.0000 | ORAL_TABLET | ORAL | Status: DC | PRN

## 2014-12-17 NOTE — Progress Notes (Signed)
Subjective:     Patient ID: Kathleen Meyers, female   DOB: 12-Sep-1955, 59 y.o.   MRN: [id number]  HPI patient presents with cyst left   Review of Systems     Objective:   Physical Exam Neurovascular status intact    Assessment:    cyst noted left lateral foot consistent with ganglionic    Plan:     Aspirated lesion and applied sterile dressings

## 2014-12-31 ENCOUNTER — Emergency Department (HOSPITAL_COMMUNITY)
Admission: EM | Admit: 2014-12-31 | Discharge: 2014-12-31 | Disposition: A | Discharge status: Home / Self Care | Payer: BLUE CROSS/BLUE SHIELD | Attending: Emergency Medicine | Admitting: Emergency Medicine

## 2014-12-31 ENCOUNTER — Emergency Department (HOSPITAL_COMMUNITY): Payer: BLUE CROSS/BLUE SHIELD

## 2014-12-31 ENCOUNTER — Encounter (HOSPITAL_COMMUNITY): Payer: Self-pay | Admitting: Emergency Medicine

## 2014-12-31 DIAGNOSIS — Z79899 Other long term (current) drug therapy: Secondary | ICD-10-CM | POA: Insufficient documentation

## 2014-12-31 DIAGNOSIS — Z90710 Acquired absence of both cervix and uterus: Secondary | ICD-10-CM | POA: Insufficient documentation

## 2014-12-31 DIAGNOSIS — M25462 Effusion, left knee: Secondary | ICD-10-CM | POA: Diagnosis present

## 2014-12-31 MED ORDER — LIDOCAINE HCL 1 % IJ SOLN
INTRAMUSCULAR | Status: AC
  Filled 2014-12-31: qty 20

## 2014-12-31 NOTE — ED Notes (Signed)
Ortho PA at the bedside for knee aspiration

## 2014-12-31 NOTE — Progress Notes (Signed)
   Subjective: Hospital day -  Patient reports pain as mild and moderate.   Patient seen in the [facility] ED for Dr. Lyla Glassing. Patient is having problems with pain in the left knee and swelling, requiring pain medications She is a runner but has not been on the road for several weeks.  Yesterday, she had a sudden increase in pain and swelling into the left knee.  No trauma, injury or accident.  No mechanical symptoms of locking or catching or buckling.  It was popping some yesterday though.  Objective: Vital signs in last 24 hours: Temp:  [97.6 F (36.4 C)] 97.6 F (36.4 C) (11/25 1029) Pulse Rate:  [65] 65 (11/25 1029) Resp:  [17] 17 (11/25 1029) BP: (140)/(97) 140/97 mmHg (11/25 1029) SpO2:  [100 %] 100 % (11/25 1029)  Intake/Output from previous day: No intake or output data in the 24 hours ending 12/31/14 1227  Intake/Output this shift:    Labs: No results for input(s): HGB in the last 72 hours. No results for input(s): WBC, RBC, HCT, PLT in the last 72 hours. No results for input(s): NA, K, CL, CO2, BUN, CREATININE, GLUCOSE, CALCIUM in the last 72 hours. No results for input(s): LABPT, INR in the last 72 hours.  EXAM General - Patient is Alert, Appropriate and Oriented Extremity - Neurovascular intact Sensation intact distally Dorsiflexion/Plantar flexion intact No cellulitis present Motor Function - intact, moving foot and toes well on exam.  Left knee with large effusion.  Guarding on exam and limited to pain from slight palpation. Tender medial joint line greater than lateral joint line.  Radiographs: IMPRESSION: 1. Moderate size toward effusion without an acute osseous lesion. Internal derangement or inflammatory arthritis is considered. 2. Calcification along the lateral meniscus. This may be related to prior trauma. Inflammatory arthritis is considered less likely. 3. Fragmentation of the superior patella appears well corticated. There is no acute injury. This may be  related to remote trauma.  Aspiration/Injection Procedure Note Kathleen Meyers [id number] 1955/02/21  Procedure: Aspiration Indications: left knee painful effusion  Procedure Details Consent: Risks of procedure as well as the alternatives and risks of each were explained to the (patient/caregiver).  Consent for procedure obtained. Time Out: Verified patient identification, verified procedure, site/side was marked, verified correct patient position, special equipment/implants available, medications/allergies/relevent history reviewed, required imaging and test results available.  Performed   Local Anesthesia Used:Lidocaine 1% plain; 24mL Amount of Fluid Aspirated: 54mL Character of Fluid: clear and nromal appearing joint fluid Fluid was not sent. A sterile dressing was applied.  Patient did tolerate procedure well. Estimated blood loss: zero  Patient had immediate improvement with pain.  Assessment/Plan: Hospital day -  Active Problems:   * No active hospital problems. *  Estimated body mass index is 28.35 kg/(m^2) as calculated from the following:   Height as of 08/05/14: 5\' 3"  (1.6 m).   Weight as of 07/22/14: 72.576 kg (160 lb).  Recommend OTC Tylenol and Aleve for pain control ACE wrap for compression. Icing of the knee Call office at [phone] to setup appointment with Dr. Lyla Glassing for next week.  Arlee Muslim, PA-C Orthopaedic Surgery 12/31/2014, 12:27 PM

## 2014-12-31 NOTE — ED Notes (Signed)
Pt complaint of left knee swelling/pain onset yesterday; denies injury.

## 2014-12-31 NOTE — ED Notes (Signed)
Patient transported to X-ray 

## 2014-12-31 NOTE — Discharge Instructions (Signed)
Knee Effusion °Knee effusion means that you have excess fluid in your knee joint. This can cause pain and swelling in your knee. This may make your knee more difficult to bend and move. That is because there is increased pain and pressure in the joint. If there is fluid in your knee, it often means that something is wrong inside your knee, such as severe arthritis, abnormal inflammation, or an infection. Another common cause of knee effusion is an injury to the knee muscles, ligaments, or cartilage. °HOME CARE INSTRUCTIONS °· Use crutches as directed by your health care provider. °· Wear a knee brace as directed by your health care provider. °· Apply ice to the swollen area: °¨ Put ice in a plastic bag. °¨ Place a towel between your skin and the bag. °¨ Leave the ice on for 20 minutes, 2-3 times per day. °· Keep your knee raised (elevated) when you are sitting or lying down. °· Take medicines only as directed by your health care provider. °· Do any rehabilitation or strengthening exercises as directed by your health care provider. °· Rest your knee as directed by your health care provider. You may start doing your normal activities again when your health care provider approves.    °· Keep all follow-up visits as directed by your health care provider. This is important. °SEEK MEDICAL CARE IF: °· You have ongoing (persistent) pain in your knee. °SEEK IMMEDIATE MEDICAL CARE IF: °· You have increased swelling or redness of your knee. °· You have severe pain in your knee. °· You have a fever. °  °This information is not intended to replace advice given to you by your health care provider. Make sure you discuss any questions you have with your health care provider. °  °Document Released: 04/14/2003 Document Revised: 02/12/2014 Document Reviewed: 09/07/2013 °Elsevier Interactive Patient Education ©2016 Elsevier Inc. ° °

## 2014-12-31 NOTE — ED Provider Notes (Signed)
CSN: [id number]     Arrival date & time 12/31/14  1024 History   First MD Initiated Contact with Patient 12/31/14 1106     Chief Complaint  Patient presents with  . Leg Pain     (Consider location/radiation/quality/duration/timing/severity/associated sxs/prior Treatment) HPI Kathleen Meyers is a 59 y.o. female presents to ED with complaint of left knee pain and swelling. States pain and swelling started yesterday. Denies any injuries. States she is a runner but has not ran in about 3 weeks. States no prior knee injuries as far as she can remember. Denies fever, chills, malaise. No joint erythema. Pt tried elevation, ice, husbands brace with no relief of swelling. Pt called orthopedics office and was told to come here.   History reviewed. No pertinent past medical history. Past Surgical History  Procedure Laterality Date  . Ent surgery as a child     . Infertility surgery     . Dilation and curettage of uterus    . Hernia repair      umbilical as a child   . Laparoscopic assisted vaginal hysterectomy N/A 08/05/2014    Procedure: LAPAROSCOPIC ASSISTED VAGINAL HYSTERECTOMY;  Surgeon: Marylynn Pearson, MD;  Location: [facility];  Service: Gynecology;  Laterality: N/A;  . Salpingoophorectomy Bilateral 08/05/2014    Procedure: BILATERAL SALPINGO OOPHORECTOMY;  Surgeon: Marylynn Pearson, MD;  Location: [facility];  Service: Gynecology;  Laterality: Bilateral;   No family history on file. Social History  Substance Use Topics  . Smoking status: Never Smoker   . Smokeless tobacco: Never Used  . Alcohol Use: No   OB History    No data available     Review of Systems  Constitutional: Negative for fever and chills.  Musculoskeletal: Positive for joint swelling and arthralgias.  Neurological: Negative for weakness and numbness.  All other systems reviewed and are negative.     Allergies  Review of patient's allergies indicates no known allergies.  Home Medications   Prior to Admission  medications   Medication Sig Start Date End Date Taking? Authorizing Provider  amLODipine (NORVASC) 5 MG tablet Take 5 mg by mouth daily.    Historical Provider, MD  atorvastatin (LIPITOR) 20 MG tablet Take 20 mg by mouth daily.    Historical Provider, MD  ibuprofen (ADVIL,MOTRIN) 600 MG tablet Take 1 tablet (600 mg total) by mouth every 6 (six) hours as needed for moderate pain. 08/06/14   Marylynn Pearson, MD  oxyCODONE-acetaminophen (PERCOCET/ROXICET) 5-325 MG tablet Take 1-2 tablets by mouth every 4 (four) hours as needed for severe pain (moderate to severe pain (when tolerating fluids)). 12/16/14   Wallene Huh, DPM  therapeutic multivitamin-minerals (THERAGRAN-M) tablet Take 1 tablet by mouth daily.    Historical Provider, MD   BP 140/97 mmHg  Pulse 65  Temp(Src) 97.6 F (36.4 C) (Oral)  Resp 17  SpO2 100% Physical Exam  Constitutional: She appears well-developed and well-nourished. No distress.  HENT:  Head: Normocephalic.  Eyes: Conjunctivae are normal.  Neck: Neck supple.  Cardiovascular: Normal rate, regular rhythm and normal heart sounds.   Pulmonary/Chest: Effort normal and breath sounds normal. No respiratory distress. She has no wheezes. She has no rales.  Musculoskeletal: She exhibits no edema.  Left knee effusion. Pain with ROM. limitted ROM due to swelling. DP pulses intact. Negative anterior and posterior drawer signs.   Neurological: She is alert.  Skin: Skin is warm and dry.  Psychiatric: She has a normal mood and affect. Her behavior is normal.  Nursing note and vitals reviewed.   ED Course  Procedures (including critical care time) Labs Review Labs Reviewed - No data to display  Imaging Review No results found. I have personally reviewed and evaluated these images and lab results as part of my medical decision-making.   EKG Interpretation None      MDM   Final diagnoses:  Knee effusion, left    Pt with left knee effusion. No signs of infection.  Pt requesting [place] to call orthopedics. I spoke with rothopedics PA with [facility] ortho, who came by and saw pt. Knee aspirated. Xray as above. Pt place din ACE wrap. Home with her own crutches, follow up with ortho.   Filed Vitals:   12/31/14 1029  BP: 140/97  Pulse: 65  Temp: 97.6 F (36.4 C)  Resp: [id number] PA-C 12/31/14 1704  Carmin Muskrat, MD 01/01/15 1205

## 2015-04-20 ENCOUNTER — Ambulatory Visit (INDEPENDENT_AMBULATORY_CARE_PROVIDER_SITE_OTHER): Payer: BLUE CROSS/BLUE SHIELD | Admitting: Podiatry

## 2015-04-20 DIAGNOSIS — M25571 Pain in right ankle and joints of right foot: Secondary | ICD-10-CM

## 2015-04-20 NOTE — Progress Notes (Signed)
Subjective:     Patient ID: Kathleen Meyers, female   DOB: [birth year], 60 y.o.   MRN: [id number]  HPI ganglionic left   Review of Systems     Objective:   Physical Exam  Ganglionic left    Assessment:     Ganglionic left    Plan:     Aspirated ganglionic

## 2015-07-01 DIAGNOSIS — E7800 Pure hypercholesterolemia, unspecified: Secondary | ICD-10-CM | POA: Diagnosis not present

## 2015-07-01 DIAGNOSIS — Z1211 Encounter for screening for malignant neoplasm of colon: Secondary | ICD-10-CM | POA: Diagnosis not present

## 2015-07-01 DIAGNOSIS — E559 Vitamin D deficiency, unspecified: Secondary | ICD-10-CM | POA: Diagnosis not present

## 2015-07-01 DIAGNOSIS — I10 Essential (primary) hypertension: Secondary | ICD-10-CM | POA: Diagnosis not present

## 2015-07-18 DIAGNOSIS — Z1231 Encounter for screening mammogram for malignant neoplasm of breast: Secondary | ICD-10-CM | POA: Diagnosis not present

## 2015-07-18 DIAGNOSIS — Z1239 Encounter for other screening for malignant neoplasm of breast: Secondary | ICD-10-CM | POA: Diagnosis not present

## 2015-07-18 DIAGNOSIS — Z01419 Encounter for gynecological examination (general) (routine) without abnormal findings: Secondary | ICD-10-CM | POA: Diagnosis not present

## 2015-07-18 DIAGNOSIS — Z6828 Body mass index (BMI) 28.0-28.9, adult: Secondary | ICD-10-CM | POA: Diagnosis not present

## 2015-08-31 DIAGNOSIS — E7800 Pure hypercholesterolemia, unspecified: Secondary | ICD-10-CM | POA: Diagnosis not present

## 2015-08-31 DIAGNOSIS — Z79899 Other long term (current) drug therapy: Secondary | ICD-10-CM | POA: Diagnosis not present

## 2016-01-08 DIAGNOSIS — Z23 Encounter for immunization: Secondary | ICD-10-CM | POA: Diagnosis not present

## 2016-03-13 DIAGNOSIS — L509 Urticaria, unspecified: Secondary | ICD-10-CM | POA: Diagnosis not present

## 2016-07-03 DIAGNOSIS — I10 Essential (primary) hypertension: Secondary | ICD-10-CM | POA: Diagnosis not present

## 2016-07-03 DIAGNOSIS — E559 Vitamin D deficiency, unspecified: Secondary | ICD-10-CM | POA: Diagnosis not present

## 2016-07-03 DIAGNOSIS — E7800 Pure hypercholesterolemia, unspecified: Secondary | ICD-10-CM | POA: Diagnosis not present

## 2016-08-21 DIAGNOSIS — L720 Epidermal cyst: Secondary | ICD-10-CM | POA: Diagnosis not present

## 2016-08-21 DIAGNOSIS — L821 Other seborrheic keratosis: Secondary | ICD-10-CM | POA: Diagnosis not present

## 2016-08-21 DIAGNOSIS — L570 Actinic keratosis: Secondary | ICD-10-CM | POA: Diagnosis not present

## 2016-11-28 DIAGNOSIS — Z23 Encounter for immunization: Secondary | ICD-10-CM | POA: Diagnosis not present

## 2016-11-28 DIAGNOSIS — N958 Other specified menopausal and perimenopausal disorders: Secondary | ICD-10-CM | POA: Diagnosis not present

## 2016-11-28 DIAGNOSIS — Z6827 Body mass index (BMI) 27.0-27.9, adult: Secondary | ICD-10-CM | POA: Diagnosis not present

## 2016-11-28 DIAGNOSIS — Z01419 Encounter for gynecological examination (general) (routine) without abnormal findings: Secondary | ICD-10-CM | POA: Diagnosis not present

## 2016-11-28 DIAGNOSIS — M8588 Other specified disorders of bone density and structure, other site: Secondary | ICD-10-CM | POA: Diagnosis not present

## 2016-11-28 DIAGNOSIS — Z1231 Encounter for screening mammogram for malignant neoplasm of breast: Secondary | ICD-10-CM | POA: Diagnosis not present

## 2016-11-28 DIAGNOSIS — Z13820 Encounter for screening for osteoporosis: Secondary | ICD-10-CM | POA: Diagnosis not present

## 2016-11-29 ENCOUNTER — Other Ambulatory Visit: Payer: Self-pay | Admitting: Obstetrics and Gynecology

## 2016-11-29 DIAGNOSIS — N644 Mastodynia: Secondary | ICD-10-CM

## 2016-12-04 ENCOUNTER — Ambulatory Visit
Admission: RE | Admit: 2016-12-04 | Discharge: 2016-12-04 | Disposition: A | Discharge status: Home / Self Care | Payer: BLUE CROSS/BLUE SHIELD | Source: Ambulatory Visit | Attending: Obstetrics and Gynecology | Admitting: Obstetrics and Gynecology

## 2016-12-04 DIAGNOSIS — R922 Inconclusive mammogram: Secondary | ICD-10-CM | POA: Diagnosis not present

## 2016-12-04 DIAGNOSIS — N6311 Unspecified lump in the right breast, upper outer quadrant: Secondary | ICD-10-CM | POA: Diagnosis not present

## 2016-12-04 DIAGNOSIS — N644 Mastodynia: Secondary | ICD-10-CM

## 2017-07-04 DIAGNOSIS — I10 Essential (primary) hypertension: Secondary | ICD-10-CM | POA: Diagnosis not present

## 2017-07-04 DIAGNOSIS — E559 Vitamin D deficiency, unspecified: Secondary | ICD-10-CM | POA: Diagnosis not present

## 2017-07-04 DIAGNOSIS — E7800 Pure hypercholesterolemia, unspecified: Secondary | ICD-10-CM | POA: Diagnosis not present

## 2017-11-07 ENCOUNTER — Other Ambulatory Visit: Payer: Self-pay | Admitting: Obstetrics and Gynecology

## 2017-11-07 DIAGNOSIS — Z1231 Encounter for screening mammogram for malignant neoplasm of breast: Secondary | ICD-10-CM

## 2017-12-11 ENCOUNTER — Ambulatory Visit
Admission: RE | Admit: 2017-12-11 | Discharge: 2017-12-11 | Disposition: A | Discharge status: Home / Self Care | Payer: BLUE CROSS/BLUE SHIELD | Source: Ambulatory Visit | Attending: Obstetrics and Gynecology | Admitting: Obstetrics and Gynecology

## 2017-12-11 DIAGNOSIS — Z1231 Encounter for screening mammogram for malignant neoplasm of breast: Secondary | ICD-10-CM

## 2017-12-12 DIAGNOSIS — Z6827 Body mass index (BMI) 27.0-27.9, adult: Secondary | ICD-10-CM | POA: Diagnosis not present

## 2017-12-12 DIAGNOSIS — Z01419 Encounter for gynecological examination (general) (routine) without abnormal findings: Secondary | ICD-10-CM | POA: Diagnosis not present

## 2017-12-12 DIAGNOSIS — E785 Hyperlipidemia, unspecified: Secondary | ICD-10-CM | POA: Diagnosis not present

## 2017-12-12 DIAGNOSIS — E559 Vitamin D deficiency, unspecified: Secondary | ICD-10-CM | POA: Diagnosis not present

## 2018-01-15 DIAGNOSIS — H43811 Vitreous degeneration, right eye: Secondary | ICD-10-CM | POA: Diagnosis not present

## 2018-02-10 DIAGNOSIS — H0011 Chalazion right upper eyelid: Secondary | ICD-10-CM | POA: Diagnosis not present

## 2018-07-11 DIAGNOSIS — E559 Vitamin D deficiency, unspecified: Secondary | ICD-10-CM | POA: Diagnosis not present

## 2018-07-11 DIAGNOSIS — I10 Essential (primary) hypertension: Secondary | ICD-10-CM | POA: Diagnosis not present

## 2018-07-11 DIAGNOSIS — R7301 Impaired fasting glucose: Secondary | ICD-10-CM | POA: Diagnosis not present

## 2018-07-11 DIAGNOSIS — E7800 Pure hypercholesterolemia, unspecified: Secondary | ICD-10-CM | POA: Diagnosis not present

## 2018-08-06 DIAGNOSIS — L259 Unspecified contact dermatitis, unspecified cause: Secondary | ICD-10-CM | POA: Diagnosis not present

## 2018-12-01 ENCOUNTER — Other Ambulatory Visit: Payer: Self-pay | Admitting: Obstetrics and Gynecology

## 2018-12-01 DIAGNOSIS — Z1231 Encounter for screening mammogram for malignant neoplasm of breast: Secondary | ICD-10-CM

## 2019-01-16 ENCOUNTER — Other Ambulatory Visit: Payer: Self-pay

## 2019-01-16 ENCOUNTER — Ambulatory Visit
Admission: RE | Admit: 2019-01-16 | Discharge: 2019-01-16 | Disposition: A | Discharge status: Home / Self Care | Payer: BLUE CROSS/BLUE SHIELD | Source: Ambulatory Visit | Attending: Obstetrics and Gynecology | Admitting: Obstetrics and Gynecology

## 2019-01-16 DIAGNOSIS — Z1231 Encounter for screening mammogram for malignant neoplasm of breast: Secondary | ICD-10-CM | POA: Diagnosis not present

## 2019-01-20 DIAGNOSIS — Z6828 Body mass index (BMI) 28.0-28.9, adult: Secondary | ICD-10-CM | POA: Diagnosis not present

## 2019-01-20 DIAGNOSIS — Z01419 Encounter for gynecological examination (general) (routine) without abnormal findings: Secondary | ICD-10-CM | POA: Diagnosis not present

## 2019-02-11 DIAGNOSIS — H33301 Unspecified retinal break, right eye: Secondary | ICD-10-CM | POA: Diagnosis not present

## 2019-02-11 DIAGNOSIS — H2513 Age-related nuclear cataract, bilateral: Secondary | ICD-10-CM | POA: Diagnosis not present

## 2019-02-11 DIAGNOSIS — H524 Presbyopia: Secondary | ICD-10-CM | POA: Diagnosis not present

## 2019-02-13 ENCOUNTER — Ambulatory Visit: Payer: BLUE CROSS/BLUE SHIELD | Attending: Internal Medicine

## 2019-02-13 DIAGNOSIS — Z20822 Contact with and (suspected) exposure to covid-19: Secondary | ICD-10-CM | POA: Diagnosis not present

## 2019-02-15 LAB — NOVEL CORONAVIRUS, NAA: SARS-CoV-2, NAA: NOT DETECTED

## 2019-07-14 DIAGNOSIS — E559 Vitamin D deficiency, unspecified: Secondary | ICD-10-CM | POA: Diagnosis not present

## 2019-07-14 DIAGNOSIS — E7800 Pure hypercholesterolemia, unspecified: Secondary | ICD-10-CM | POA: Diagnosis not present

## 2019-07-14 DIAGNOSIS — I10 Essential (primary) hypertension: Secondary | ICD-10-CM | POA: Diagnosis not present

## 2019-07-14 DIAGNOSIS — R7301 Impaired fasting glucose: Secondary | ICD-10-CM | POA: Diagnosis not present

## 2019-10-16 DIAGNOSIS — H35372 Puckering of macula, left eye: Secondary | ICD-10-CM | POA: Diagnosis not present

## 2019-10-16 DIAGNOSIS — H43813 Vitreous degeneration, bilateral: Secondary | ICD-10-CM | POA: Diagnosis not present

## 2019-10-16 DIAGNOSIS — Z1159 Encounter for screening for other viral diseases: Secondary | ICD-10-CM | POA: Diagnosis not present

## 2019-10-21 DIAGNOSIS — Z1211 Encounter for screening for malignant neoplasm of colon: Secondary | ICD-10-CM | POA: Diagnosis not present

## 2019-10-21 DIAGNOSIS — D123 Benign neoplasm of transverse colon: Secondary | ICD-10-CM | POA: Diagnosis not present

## 2019-10-21 DIAGNOSIS — D124 Benign neoplasm of descending colon: Secondary | ICD-10-CM | POA: Diagnosis not present

## 2019-10-29 DIAGNOSIS — H43813 Vitreous degeneration, bilateral: Secondary | ICD-10-CM | POA: Diagnosis not present

## 2019-11-30 DIAGNOSIS — Z13820 Encounter for screening for osteoporosis: Secondary | ICD-10-CM | POA: Diagnosis not present

## 2020-06-03 IMAGING — MG DIGITAL SCREENING BILAT W/ TOMO W/ CAD
8 series · 8 of 24 positions shown · non-contrast
Comparison: Previous exam(s).

CLINICAL DATA: Screening.

EXAM:
DIGITAL SCREENING BILATERAL MAMMOGRAM WITH TOMO AND CAD

[L MLO synth-2D]
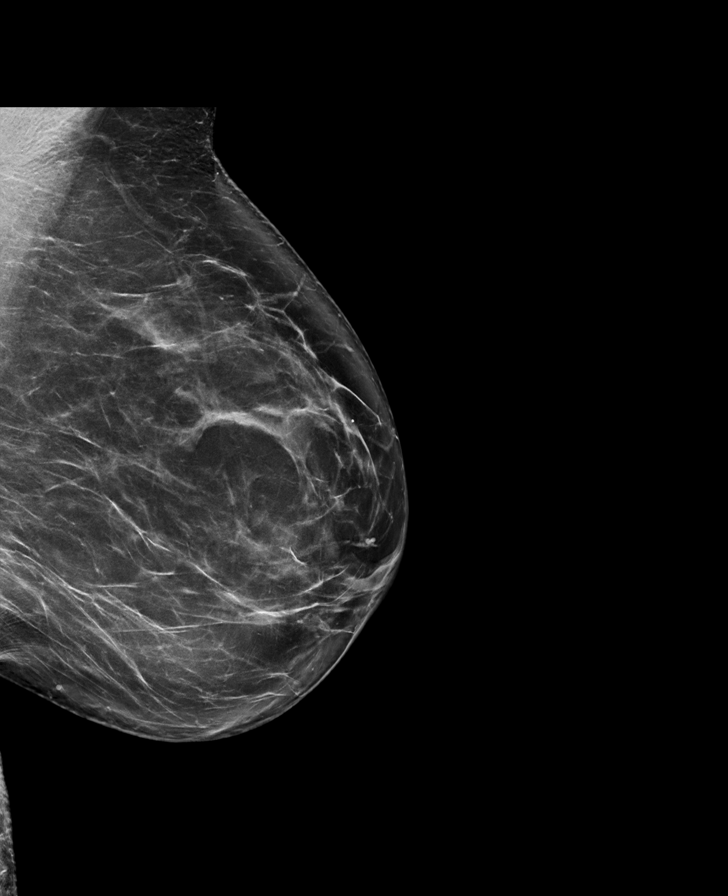

[R CC synth-2D]
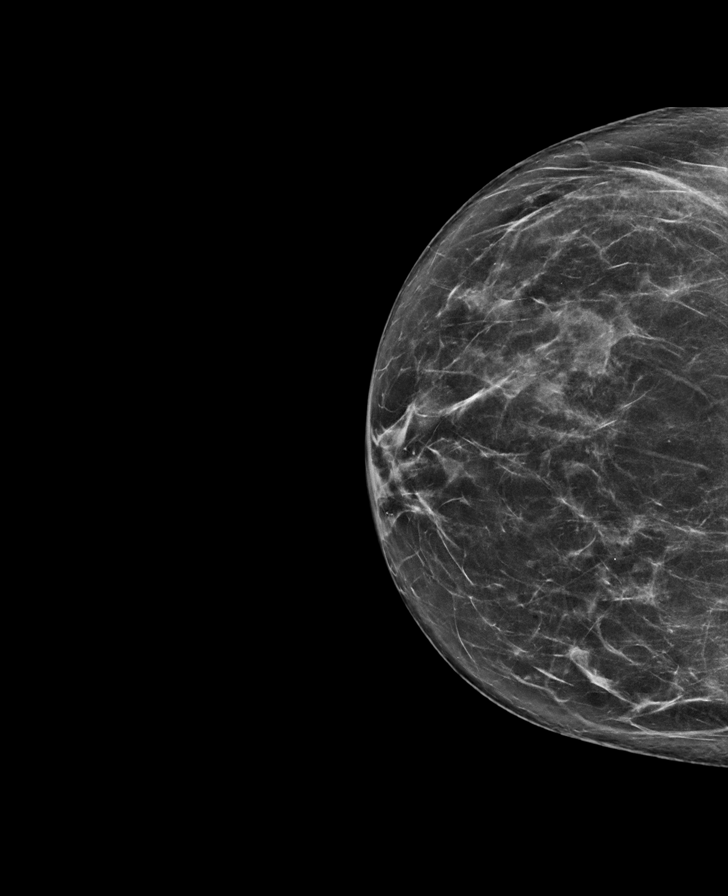

[R MLO synth-2D]
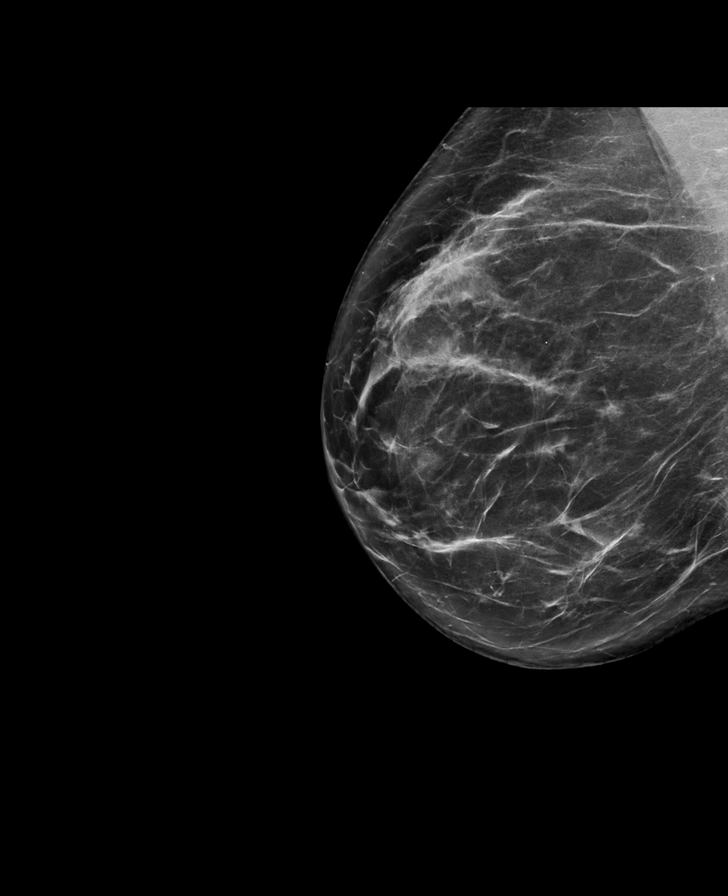

[L CC synth-2D]
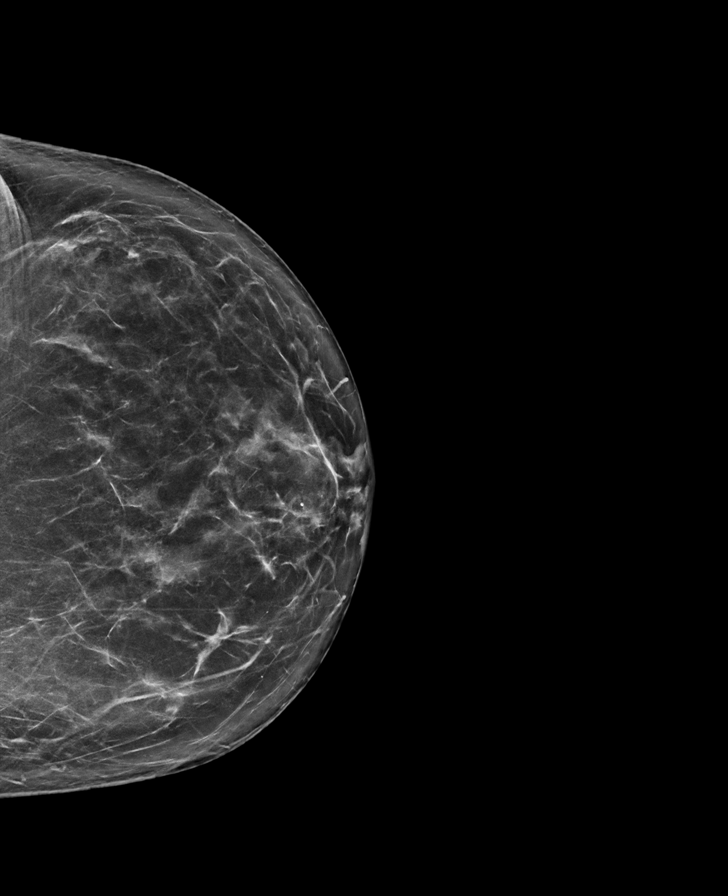

[R CC tomo · tomo slice 38/75.0]
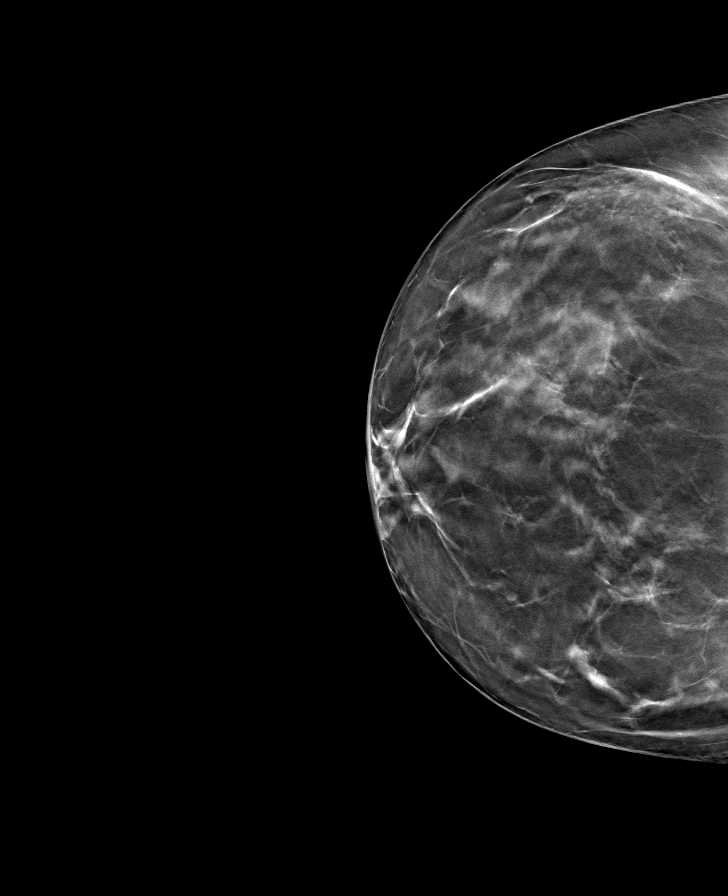

[R MLO tomo · tomo slice 45/89.0]
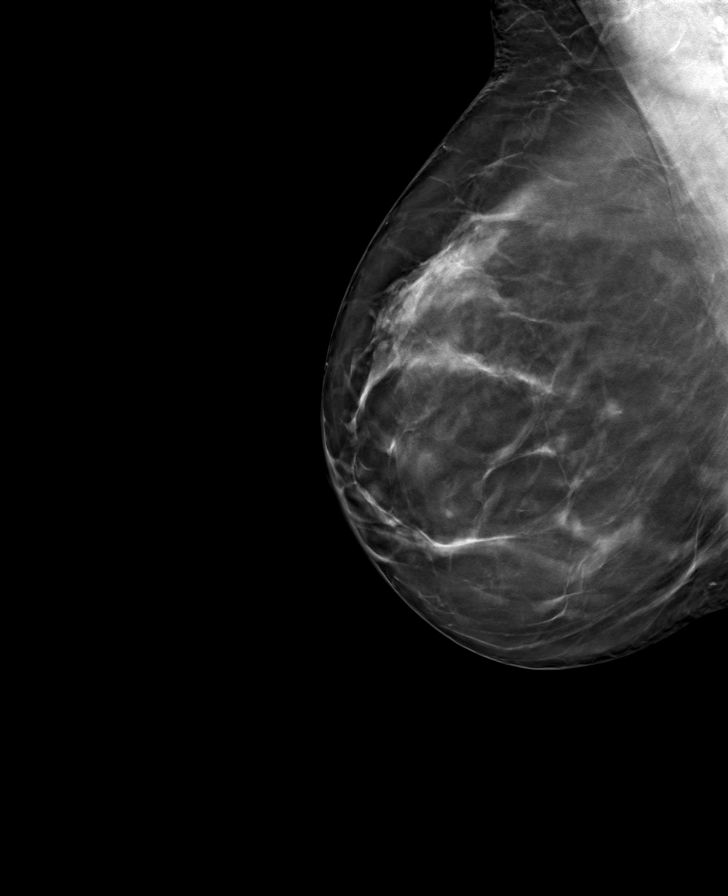

[L MLO tomo · tomo slice 47/93.0]
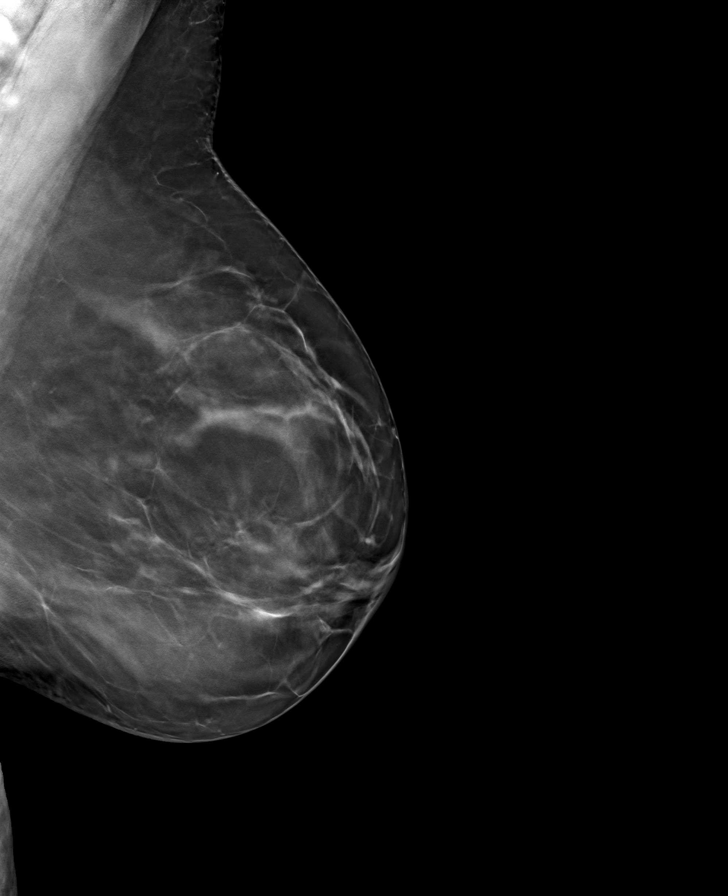

[L CC tomo · tomo slice 41/81.0]
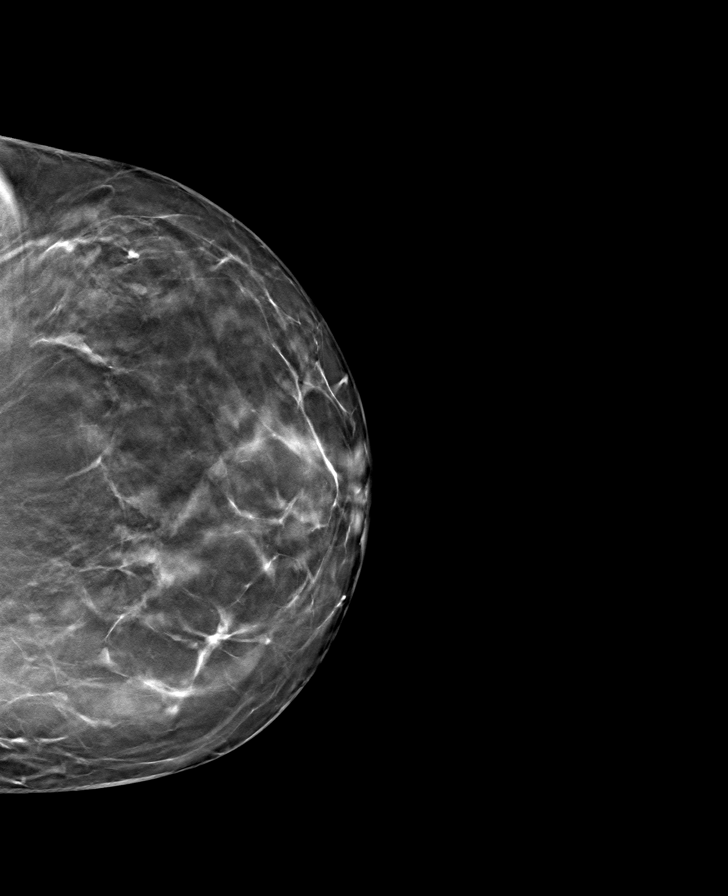

[8 of 24 positions shown; findings below may reference images not displayed]

ACR Breast Density Category b: There are scattered areas of
fibroglandular density.
FINDINGS: There are no findings suspicious for malignancy. Images were
processed with CAD.
IMPRESSION: No mammographic evidence of malignancy. A result letter of this
screening mammogram will be mailed directly to the patient.

RECOMMENDATION:
Screening mammogram in one year. (Code:[id number])

BI-RADS CATEGORY  1: Negative.

## 2021-03-08 DIAGNOSIS — Z01419 Encounter for gynecological examination (general) (routine) without abnormal findings: Secondary | ICD-10-CM | POA: Diagnosis not present

## 2021-03-08 DIAGNOSIS — Z6828 Body mass index (BMI) 28.0-28.9, adult: Secondary | ICD-10-CM | POA: Diagnosis not present

## 2021-03-08 DIAGNOSIS — Z1231 Encounter for screening mammogram for malignant neoplasm of breast: Secondary | ICD-10-CM | POA: Diagnosis not present

## 2021-05-31 DIAGNOSIS — R059 Cough, unspecified: Secondary | ICD-10-CM | POA: Diagnosis not present

## 2021-05-31 DIAGNOSIS — J069 Acute upper respiratory infection, unspecified: Secondary | ICD-10-CM | POA: Diagnosis not present

## 2021-06-14 DIAGNOSIS — D692 Other nonthrombocytopenic purpura: Secondary | ICD-10-CM | POA: Diagnosis not present

## 2021-06-23 DIAGNOSIS — M1712 Unilateral primary osteoarthritis, left knee: Secondary | ICD-10-CM | POA: Diagnosis not present

## 2021-07-14 DIAGNOSIS — Z0000 Encounter for general adult medical examination without abnormal findings: Secondary | ICD-10-CM | POA: Diagnosis not present

## 2021-07-14 DIAGNOSIS — M8580 Other specified disorders of bone density and structure, unspecified site: Secondary | ICD-10-CM | POA: Diagnosis not present

## 2021-07-14 DIAGNOSIS — E559 Vitamin D deficiency, unspecified: Secondary | ICD-10-CM | POA: Diagnosis not present

## 2021-07-14 DIAGNOSIS — M179 Osteoarthritis of knee, unspecified: Secondary | ICD-10-CM | POA: Diagnosis not present

## 2021-07-14 DIAGNOSIS — E7800 Pure hypercholesterolemia, unspecified: Secondary | ICD-10-CM | POA: Diagnosis not present

## 2021-07-14 DIAGNOSIS — I10 Essential (primary) hypertension: Secondary | ICD-10-CM | POA: Diagnosis not present

## 2021-11-15 DIAGNOSIS — H0011 Chalazion right upper eyelid: Secondary | ICD-10-CM | POA: Diagnosis not present

## 2021-12-14 DIAGNOSIS — M1712 Unilateral primary osteoarthritis, left knee: Secondary | ICD-10-CM | POA: Diagnosis not present

## 2022-01-08 DIAGNOSIS — H2511 Age-related nuclear cataract, right eye: Secondary | ICD-10-CM | POA: Diagnosis not present

## 2022-12-28 ENCOUNTER — Other Ambulatory Visit (HOSPITAL_COMMUNITY): Payer: Self-pay | Admitting: Family Medicine

## 2022-12-28 DIAGNOSIS — E7800 Pure hypercholesterolemia, unspecified: Secondary | ICD-10-CM

## 2023-01-07 ENCOUNTER — Ambulatory Visit (HOSPITAL_COMMUNITY)
Admission: RE | Admit: 2023-01-07 | Discharge: 2023-01-07 | Disposition: A | Discharge status: Home / Self Care | Payer: Self-pay | Source: Ambulatory Visit | Attending: Family Medicine | Admitting: Family Medicine

## 2023-01-07 DIAGNOSIS — E7800 Pure hypercholesterolemia, unspecified: Secondary | ICD-10-CM | POA: Insufficient documentation

## 2023-04-03 ENCOUNTER — Telehealth: Payer: Self-pay | Admitting: Radiology

## 2023-04-03 ENCOUNTER — Ambulatory Visit (INDEPENDENT_AMBULATORY_CARE_PROVIDER_SITE_OTHER): Payer: Medicare Other | Admitting: Orthopaedic Surgery

## 2023-04-03 ENCOUNTER — Encounter: Payer: Self-pay | Admitting: Orthopaedic Surgery

## 2023-04-03 DIAGNOSIS — M1712 Unilateral primary osteoarthritis, left knee: Secondary | ICD-10-CM | POA: Diagnosis not present

## 2023-04-03 DIAGNOSIS — G8929 Other chronic pain: Secondary | ICD-10-CM | POA: Diagnosis not present

## 2023-04-03 DIAGNOSIS — M25562 Pain in left knee: Secondary | ICD-10-CM | POA: Diagnosis not present

## 2023-04-03 NOTE — Telephone Encounter (Signed)
 Patient had called me regarding the records for today's appt, and I did not get to call her until today.   She told me the appt went well.  She asked if the fluid returned, would she be able to return for an aspiration, and I advised her yes, to a point prior to surgery, she could. She asked about exercises, I advised any quad strengthening, stationary bike, or non-loading exercise or PT would not hurt.  She will get those records to [place] when they arrive.  FYI to you only.

## 2023-04-03 NOTE — Progress Notes (Signed)
 The patient is a very present and active 68 year old female that I am seeing for the first time.  This is for chronic left knee pain and mainly a second opinion as a relates to the known arthritis of her left knee.  She used to be an avid runner.  She still works and does work at the [employer] which is coming up in April.  She has had steroid injections in her knee.  She has seen one of our colleagues in town.  She said they have never been able to aspirate any fluid from her knee but she says her knee is swollen.  She said the last time she had a steroid injection which was about 3 months ago did not really help for only just a few days.  She is an active individual still.  She is not a diabetic.  She does not have any significant medical problems and I was able to review her past medical history and medications within epic.  She does wish to discuss knee replacement surgery and talk about what to expect from an intraoperative and postoperative standpoint and recovery.  She walks without assist device.  She is not obese  She denies any headache, chest pain, shortness of breath, fever, chills, nausea, vomiting  Examination of her left knee shows varus malalignment that is correctable.  There is a moderate knee joint effusion when comparing her left and right knees.  The left knee is definitely swollen.  She does have good range of motion of the knee and it does feel stable.  X-rays of the company of the left knee show varus malalignment with bone-on-bone wear the medial compartment and patellofemoral joint.  There are osteophytes in all 3 compartments and again complete loss of the medial joint space.  We had a long and thorough discussion about knee replacement surgery.  I discussed the risk and benefits of the surgery and what to expect from an intraoperative and postoperative standpoint.  I showed her knee replacement model as well.  I was then able to lay her down in supine position and  after 5 cc of lidocaine in her knee aspirated over 30 cc of clear yellow fluid from her left knee.  I felt comfortable with placing a steroid injection in the knee at this visit.  She tolerated this very well and felt significantly better after the aspiration and injection.  She is interested in being scheduled for knee replacement surgery so I did let her know that we will fill out a surgery schedule sheet and we will be in touch with scheduling the surgery.  All questions and concerns were answered and addressed.

## 2023-06-07 ENCOUNTER — Ambulatory Visit (HOSPITAL_BASED_OUTPATIENT_CLINIC_OR_DEPARTMENT_OTHER): Admitting: Internal Medicine

## 2023-06-07 ENCOUNTER — Encounter (HOSPITAL_BASED_OUTPATIENT_CLINIC_OR_DEPARTMENT_OTHER): Payer: Self-pay | Admitting: Internal Medicine

## 2023-06-07 VITALS — BP 140/80 | HR 69 | Ht 63.0 in | Wt 169.0 lb

## 2023-06-07 DIAGNOSIS — I2510 Atherosclerotic heart disease of native coronary artery without angina pectoris: Secondary | ICD-10-CM | POA: Diagnosis not present

## 2023-06-07 DIAGNOSIS — I493 Ventricular premature depolarization: Secondary | ICD-10-CM

## 2023-06-07 DIAGNOSIS — E785 Hyperlipidemia, unspecified: Secondary | ICD-10-CM

## 2023-06-07 DIAGNOSIS — R079 Chest pain, unspecified: Secondary | ICD-10-CM

## 2023-06-07 NOTE — Patient Instructions (Signed)
 Medication Instructions:  Your physician recommends that you continue on your current medications as directed. Please refer to the Current Medication list given to you today.  *If you need a refill on your cardiac medications before your next appointment, please call your pharmacy*  Lab Work: Lpa today If you have labs (blood work) drawn today and your tests are completely normal, you will receive your results only by: MyChart Message (if you have MyChart) OR A paper copy in the mail If you have any lab test that is abnormal or we need to change your treatment, we will call you to review the results.  Testing/Procedures:    Please report to Radiology at the [facility] Main Entrance 30 minutes early for your test.  [address] [place], [place] [address]                         OR   Please report to Radiology at [facility] Main Entrance, medical mall, 30 mins prior to your test.  [address]  [place], [place]  How to Prepare for Your Cardiac PET/CT Stress Test:  Nothing to eat or drink, except water, 3 hours prior to arrival time.  NO caffeine/decaffeinated products, or chocolate 12 hours prior to arrival. (Please note decaffeinated beverages (teas/coffees) still contain caffeine).  If you have caffeine within 12 hours prior, the test will need to be rescheduled.   You may take your remaining medications with water.  NO perfume, cologne or lotion on chest or abdomen area. FEMALES - Please avoid wearing dresses to this appointment.  Total time is 1 to 2 hours; you may want to bring reading material for the waiting time.  IF YOU THINK YOU MAY BE PREGNANT, OR ARE NURSING PLEASE INFORM THE TECHNOLOGIST.  In preparation for your appointment, medication and supplies will be purchased.  Appointment availability is limited, so if you need to cancel or reschedule, please call the Radiology Department Scheduler at [phone] 24 hours in  advance to avoid a cancellation fee of $100.00  What to Expect When you Arrive:  Once you arrive and check in for your appointment, you will be taken to a preparation room within the Radiology Department.  A technologist or Nurse will obtain your medical history, verify that you are correctly prepped for the exam, and explain the procedure.  Afterwards, an IV will be started in your arm and electrodes will be placed on your skin for EKG monitoring during the stress portion of the exam. Then you will be escorted to the PET/CT scanner.  There, staff will get you positioned on the scanner and obtain a blood pressure and EKG.  During the exam, you will continue to be connected to the EKG and blood pressure machines.  A small, safe amount of a radioactive tracer will be injected in your IV to obtain a series of pictures of your heart along with an injection of a stress agent.    After your Exam:  It is recommended that you eat a meal and drink a caffeinated beverage to counter act any effects of the stress agent.  Drink plenty of fluids for the remainder of the day and urinate frequently for the first couple of hours after the exam.  Your doctor will inform you of your test results within 7-10 business days.  For more information and frequently asked questions, please visit our website: [email or web address]  For questions about your test  or how to prepare for your test, please call: Cardiac Imaging Nurse Navigators Office: [phone]    Follow-Up: At [facility], you and your health needs are our priority.  As part of our continuing mission to provide you with exceptional heart care, our providers are all part of one team.  This team includes your primary Cardiologist (physician) and Advanced Practice Providers or APPs (Physician Assistants and Nurse Practitioners) who all work together to provide you with the care you need, when you need it.  Your next appointment:   3  month(s)  Provider:   Slater Duncan, NP    Other Instructions

## 2023-06-07 NOTE — Progress Notes (Signed)
 LIPID CLINIC CONSULT NOTE  Chief Complaint:  Manage dyslipidemia, coronary artery calcification, fatigue  Primary Care Physician: Ransom Byers, MD  Primary Cardiologist:  None  HPI:  Kathleen Meyers is a 68 y.o. female who is being seen today for the evaluation of dyslipidemia, coronary artery calcification at the request of Ransom Byers, *.  This is a pleasant 68 year old female, referred for evaluation management of dyslipidemia.  She was found to have a high calcium  score in December 2024, which showed a calcium  score of 254, 87 percentile for age and sex matched controls.  Aortic atherosclerosis was also noted.  Importantly there was a calcium  score of 114 in the left main with some calcium  in the LAD, circumflex and right coronary.  When asked today if she was having any symptoms with this she did report that recently she has noticed that she is more short of breath.  She says she has been a runner and has not been able to do that for some time but does exercise on the [employer] bike.  She gets somewhat either short of breath or fatigue and is not able to go the amount of exercise that she thought she should be able to do.  Other risk factors include hypertension.  She is on atorvastatin  40 mg daily.  Lipids in February showed total cholesterol 203, triglycerides 196, HDL 53 and LDL 116.  Her target LDL should be less than 70.  She does report heart disease in her family.  PMHx:  No past medical history on file.  Past Surgical History:  Procedure Laterality Date   BREAST CYST ASPIRATION Left    DILATION AND CURETTAGE OF UTERUS     ENT surgery as a child      HERNIA REPAIR     umbilical as a child    infertility surgery      LAPAROSCOPIC ASSISTED VAGINAL HYSTERECTOMY N/A 08/05/2014   Procedure: LAPAROSCOPIC ASSISTED VAGINAL HYSTERECTOMY;  Surgeon: Ashby Lawman, MD;  Location: [facility];  Service: Gynecology;  Laterality: N/A;   SALPINGOOPHORECTOMY Bilateral  08/05/2014   Procedure: BILATERAL SALPINGO OOPHORECTOMY;  Surgeon: Ashby Lawman, MD;  Location: [facility];  Service: Gynecology;  Laterality: Bilateral;    FAMHx:  Family History  Problem Relation Age of Onset   Breast cancer Sister 37    SOCHx:   reports that she has never smoked. She has never used smokeless tobacco. She reports that she does not drink alcohol and does not use drugs.  ALLERGIES:  No Known Allergies  ROS: Pertinent items noted in HPI and remainder of comprehensive ROS otherwise negative.  HOME MEDS: Current Outpatient Medications on File Prior to Visit  Medication Sig Dispense Refill   amLODipine  (NORVASC ) 5 MG tablet Take 5 mg by mouth daily.     atorvastatin  (LIPITOR) 20 MG tablet Take 20 mg by mouth daily.     ibuprofen  (ADVIL ,MOTRIN ) 600 MG tablet Take 1 tablet (600 mg total) by mouth every 6 (six) hours as needed for moderate pain. 30 tablet 0   therapeutic multivitamin-minerals (THERAGRAN-M) tablet Take 1 tablet by mouth daily.     oxyCODONE -acetaminophen  (PERCOCET/ROXICET) 5-325 MG tablet Take 1-2 tablets by mouth every 4 (four) hours as needed for severe pain (moderate to severe pain (when tolerating fluids)). 30 tablet 0   No current facility-administered medications on file prior to visit.    LABS/IMAGING: No results found for this or any previous visit (from the past 48 hours). No results found.  LIPID PANEL: No results found for: "CHOL", "TRIG", "HDL", "CHOLHDL", "VLDL", "LDLCALC", "LDLDIRECT"  WEIGHTS: Wt Readings from Last 3 Encounters:  06/07/23 169 lb (76.7 kg)  08/05/14 160 lb (72.6 kg)  07/22/14 160 lb (72.6 kg)    VITALS: BP (!) 140/80   Pulse 69   Ht 5\' 3"  (1.6 m)   Wt 169 lb (76.7 kg)   SpO2 97%   BMI 29.94 kg/m   EXAM: General appearance: alert and no distress Lungs: clear to auscultation bilaterally Heart: regular rate and rhythm Extremities: extremities normal, atraumatic, no cyanosis or edema Neurologic: Grossly  normal  EKG: EKG Interpretation Date/Time:  Friday Jun 07 2023 09:18:50 EDT Ventricular Rate:  75 PR Interval:  162 QRS Duration:  96 QT Interval:  394 QTC Calculation: 439 R Axis:   -34  Text Interpretation: Sinus rhythm with occasional Premature ventricular complexes Left axis deviation Moderate voltage criteria for LVH, may be normal variant ( R in aVL , Cornell product ) When compared with ECG of 22-Jul-2014 10:09, Premature ventricular complexes are now Present Premature atrial complexes are no longer Present Confirmed by Dinah Franco [phone]) on 06/07/2023 1:16:08 PM    ASSESSMENT: Exertional fatigue and dyspnea 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   Kathleen Meyers is noted to have some exertional fatigue and dyspnea with exercise.  She was found to have multivessel coronary artery calcification including left main calcium .  I did explain the importance of this and the fact that further evaluation for ischemia in the setting of possible symptoms is indicated.  The best test probably for this would be a PET CT scan although there is some delay in getting this test performed due to decreased availability of slots.  Nonetheless I do not feel at this point it is emergent however if she were to have any symptoms that were worsening or did not go away at rest then she should seek emergency department care.  Her lipids however are not yet at target as well.  She is on high-dose statin.  She would likely benefit from a PCSK9 inhibitor.  It would be helpful to get an LP(a) to see if that is elevated and further suggest that treatment with the PCSK9 inhibitor would be the best addition to her statin.  Finally on EKG today she was found to have PVCs.  She says she is asymptomatic with this.  She may wish to monitor this with exercise using a watch  that might demonstrate an EKG.  Thanks again for the kind referral.  Plan follow-up with me after her testing.  Hazle Lites, MD, [facility], FNLA, FACP  [place]  [facility]  Medical Director of the Advanced Lipid Disorders &  Cardiovascular Risk Reduction Clinic Diplomate of the American Board of Clinical Lipidology Attending Cardiologist  Direct Dial: [phone]  Fax: [phone]  Website:  [email or web address] 06/07/2023, 1:17 PM

## 2023-06-08 LAB — LIPOPROTEIN A (LPA): Lipoprotein (a): 11.1 nmol/L (ref <75.0)

## 2023-06-17 ENCOUNTER — Encounter (HOSPITAL_COMMUNITY): Payer: Self-pay

## 2023-06-18 ENCOUNTER — Ambulatory Visit (HOSPITAL_BASED_OUTPATIENT_CLINIC_OR_DEPARTMENT_OTHER): Payer: Self-pay | Admitting: Internal Medicine

## 2023-06-18 ENCOUNTER — Ambulatory Visit (HOSPITAL_COMMUNITY)
Admission: RE | Admit: 2023-06-18 | Discharge: 2023-06-18 | Disposition: A | Discharge status: Home / Self Care | Source: Ambulatory Visit | Attending: Internal Medicine | Admitting: Internal Medicine

## 2023-06-18 DIAGNOSIS — E785 Hyperlipidemia, unspecified: Secondary | ICD-10-CM | POA: Diagnosis present

## 2023-06-18 DIAGNOSIS — R079 Chest pain, unspecified: Secondary | ICD-10-CM | POA: Insufficient documentation

## 2023-06-18 LAB — NM PET CT CARDIAC PERFUSION MULTI W/ABSOLUTE BLOODFLOW
MBFR: 2.01
Nuc Rest EF: 55 %
Nuc Stress EF: 55 %
Rest MBF: 0.95 ml/g/min
Rest Nuclear Isotope Dose: 20 mCi
ST Depression (mm): 0 mm
Stress MBF: 1.91 ml/g/min
Stress Nuclear Isotope Dose: 20 mCi
TID: 1.04

## 2023-06-18 MED ORDER — REGADENOSON 0.4 MG/5ML IV SOLN
0.4000 mg | Freq: Once | INTRAVENOUS | Status: AC
  Administered 2023-06-18: 0.4 mg via INTRAVENOUS

## 2023-06-18 MED ORDER — REGADENOSON 0.4 MG/5ML IV SOLN
INTRAVENOUS | Status: AC
  Filled 2023-06-18: qty 5

## 2023-06-18 MED ORDER — RUBIDIUM RB82 GENERATOR (RUBYFILL)
19.9000 | PACK | Freq: Once | INTRAVENOUS | Status: AC
  Administered 2023-06-18: 19.9 via INTRAVENOUS

## 2023-06-18 MED ORDER — RUBIDIUM RB82 GENERATOR (RUBYFILL)
19.8900 | PACK | Freq: Once | INTRAVENOUS | Status: AC
  Administered 2023-06-18: 19.89 via INTRAVENOUS

## 2023-06-20 NOTE — Telephone Encounter (Signed)
 Spoke with patient about cardiac PET results, LPa (negative), and lipid goals (LDL less than 70 per MD note). She is on atorva 20 right now. She prefers to work on diet and lifestyle modifications and repeat fasting labs in 3-4 months.   Advised will send her diet info from [facility] for review. If not sufficient, she may request dietician referral.   She also mentioned that occasionally it feels like her heart races. She has PVCs noted on last EKG. Advised to monitor frequency and possible triggers such as stress/anxiety, poor sleep, caffeine. She agreed w/plan.   Routed to MD to advise on preferred lab order - lipid panel or NMR

## 2023-07-03 ENCOUNTER — Institutional Professional Consult (permissible substitution) (HOSPITAL_BASED_OUTPATIENT_CLINIC_OR_DEPARTMENT_OTHER): Payer: Medicare Other | Admitting: Internal Medicine

## 2023-07-04 ENCOUNTER — Ambulatory Visit

## 2023-07-30 ENCOUNTER — Other Ambulatory Visit (HOSPITAL_COMMUNITY): Payer: Self-pay | Admitting: Family Medicine

## 2023-07-30 ENCOUNTER — Ambulatory Visit (HOSPITAL_COMMUNITY)
Admission: RE | Admit: 2023-07-30 | Discharge: 2023-07-30 | Disposition: A | Discharge status: Home / Self Care | Source: Ambulatory Visit | Attending: Vascular Surgery | Admitting: Vascular Surgery

## 2023-07-30 DIAGNOSIS — M79605 Pain in left leg: Secondary | ICD-10-CM

## 2023-08-01 ENCOUNTER — Ambulatory Visit: Payer: Self-pay | Admitting: Podiatry

## 2023-09-12 ENCOUNTER — Telehealth: Payer: Self-pay | Admitting: Orthopaedic Surgery

## 2023-09-12 NOTE — Telephone Encounter (Signed)
 Patient left message on voicemail stating she would like to move forward with left total knee replacement and was told to call back around the beginning of August if wanting anything in December.  Patient is hoping to secure a date for early December (possibly 2nd for 5th would be ideal).

## 2023-09-19 ENCOUNTER — Telehealth: Payer: Self-pay

## 2023-09-19 NOTE — Telephone Encounter (Signed)
 I called patient to discuss scheduling left TKA for after Thanksgiving.  Left voice mail message for return call.

## 2023-09-25 ENCOUNTER — Ambulatory Visit: Admitting: Internal Medicine

## 2023-10-02 ENCOUNTER — Ambulatory Visit (INDEPENDENT_AMBULATORY_CARE_PROVIDER_SITE_OTHER): Admitting: Orthopaedic Surgery

## 2023-10-02 VITALS — Ht 63.78 in | Wt 171.4 lb

## 2023-10-02 DIAGNOSIS — M1712 Unilateral primary osteoarthritis, left knee: Secondary | ICD-10-CM | POA: Diagnosis not present

## 2023-10-02 NOTE — Progress Notes (Signed)
 The patient is an active 68 year old female who we saw in February of this year secondary to tricompartment arthritis of her left knee.  She is in need of knee replacement surgery but ended up postponing surgery for other issues that she had going on.  She is still considering surgery sometime after the holidays or later this year.  However she did let me know that she had to see cardiology after having a a CT cardiac calcium  study performed and she states that the cardiologist said he would not clear her for surgery.  She does have a follow-up appoint with cardiology in October.  She is on aspirin daily.  She did recently fall which was accidental tripping and she sustained a contusion to her left knee.  On exam there is no effusion today with the left knee.  There is patellofemoral crepitation.  Her extensor mechanism is intact.  She does have an abrasion over her tibial tubercle.  Will treat the abrasion with mupirocin ointment daily until this heals.  She is considering a steroid injection in her knee later in October around the furniture market time if she is having enough pain.  She will see her cardiologist in October and see about the potential for being cleared for a knee replacement for her left knee.  When we do see her in the office again, we should obtain a standing AP and lateral of her left knee.  I did encourage her to keep her follow-up with cardiology as well.

## 2023-11-04 ENCOUNTER — Ambulatory Visit: Admitting: Physician Assistant

## 2023-11-11 ENCOUNTER — Ambulatory Visit (INDEPENDENT_AMBULATORY_CARE_PROVIDER_SITE_OTHER): Admitting: Physician Assistant

## 2023-11-11 ENCOUNTER — Other Ambulatory Visit: Payer: Self-pay

## 2023-11-11 ENCOUNTER — Encounter: Payer: Self-pay | Admitting: Physician Assistant

## 2023-11-11 DIAGNOSIS — M25562 Pain in left knee: Secondary | ICD-10-CM | POA: Diagnosis not present

## 2023-11-11 DIAGNOSIS — G8929 Other chronic pain: Secondary | ICD-10-CM

## 2023-11-11 MED ORDER — LIDOCAINE HCL 1 % IJ SOLN
3.0000 mL | INTRAMUSCULAR | Status: AC | PRN
  Administered 2023-11-11: 3 mL

## 2023-11-11 NOTE — Progress Notes (Signed)
 Office Visit Note   Patient: Kathleen Meyers           Date of Birth: 10-Sep-1955           MRN: [id number] Visit Date: 11/11/2023              Requested by: Chrystal Lamarr RAMAN, MD [phone] [facility] [address] [place],  [place] PCP: Chrystal Lamarr RAMAN, MD   Assessment & Plan: Visit Diagnoses:  1. Chronic pain of left knee     Plan: Per her wishes we will send her to formal physical therapy at [facility] for quad strengthening home exercise program.  Knee friendly exercises were discussed.  Questions were encouraged and answered at length.  Patient tolerated aspiration well.  Follow-Up Instructions: Return if symptoms worsen or fail to improve.   Orders:  Orders Placed This Encounter  Procedures   Large Joint Inj: L knee   XR Knee 1-2 Views Left   Ambulatory referral to Physical Therapy   No orders of the defined types were placed in this encounter.     Procedures: Large Joint Inj: L knee on 11/11/2023 8:27 AM Indications: pain Details: 22 G 1.5 in needle, superolateral approach  Arthrogram: No  Medications: 3 mL lidocaine  1 % Aspirate: 30 mL yellow Outcome: tolerated well, no immediate complications Procedure, treatment alternatives, risks and benefits explained, specific risks discussed. Consent was given by the patient. Immediately prior to procedure a time out was called to verify the correct patient, procedure, equipment, support staff and site/side marked as required. Patient was prepped and draped in the usual sterile fashion.       Clinical Data: No additional findings.   Subjective: Chief Complaint  Patient presents with   Left Knee - Pain    HPI Patient 68 year old female comes in today with left knee pain and swelling.  Wants aspiration knee.  Does not wish to have cortisone injection today.  Notes that she is scheduled for left total knee arthroplasty sometime in early January.  Still needs to be cleared by cardiology.  Has had no known injuries to  the knee.  He is taking Aleve and Tylenol  for the knee pain.  Review of Systems See HPI   Objective: Vital Signs: There were no vitals taken for this visit.  Physical Exam Pulmonary:     Effort: Pulmonary effort is normal.  Neurological:     Mental Status: She is alert.  Psychiatric:        Mood and Affect: Mood normal.     Ortho Exam Bilateral knees: No abnormal warmth erythema.  Left knee positive effusion.  Bilateral knees patellofemoral crepitus no instability.  Good range of motion of both knees.  Specialty Comments:  No specialty comments available.  Imaging: XR Knee 1-2 Views Left Result Date: 11/11/2023 Left knee: Tricompartmental arthritis.  No acute fractures.  Near bone-on-bone medial compartment.  Severe patellofemoral changes with large osteophytes off the superior and inferior inferior poles.  Knee is well located.  No acute fractures.    PMFS History: Patient Active Problem List   Diagnosis Date Noted   Unilateral primary osteoarthritis, left knee 04/03/2023   Fibroids 08/05/2014   Bunion 11/12/2006   FOOT PAIN, RIGHT 11/12/2006   MUSCLE STRAIN, LEFT BUTTOCK 07/16/2006   History reviewed. No pertinent past medical history.  Family History  Problem Relation Age of Onset   Breast cancer Sister 57    Past Surgical History:  Procedure Laterality Date   BREAST CYST  ASPIRATION Left    DILATION AND CURETTAGE OF UTERUS     ENT surgery as a child      HERNIA REPAIR     umbilical as a child    infertility surgery      LAPAROSCOPIC ASSISTED VAGINAL HYSTERECTOMY N/A 08/05/2014   Procedure: LAPAROSCOPIC ASSISTED VAGINAL HYSTERECTOMY;  Surgeon: Truman Corona, MD;  Location: [facility];  Service: Gynecology;  Laterality: N/A;   SALPINGOOPHORECTOMY Bilateral 08/05/2014   Procedure: BILATERAL SALPINGO OOPHORECTOMY;  Surgeon: Truman Corona, MD;  Location: [facility];  Service: Gynecology;  Laterality: Bilateral;   Social History   Occupational History   Not on file   Tobacco Use   Smoking status: Never   Smokeless tobacco: Never  Substance and Sexual Activity   Alcohol use: No   Drug use: No   Sexual activity: Not on file

## 2023-11-12 ENCOUNTER — Telehealth: Payer: Self-pay

## 2023-11-12 NOTE — Telephone Encounter (Signed)
   Pre-operative Risk Assessment    Patient Name: Kathleen Meyers  DOB: 1955-12-05 MRN: [id number]   Date of last office visit: 06/07/23 Date of next office visit: 11/07/23   Request for Surgical Clearance    Procedure:  Left total knee arthroplasty   Date of Surgery:  Clearance TBD                                 Surgeon:  Lonni Poli, MD  Surgeon's Group or Practice Name:  [facility] number:  [phone] Fax number:  [phone]   Type of Clearance Requested:   - Medical    Type of Anesthesia:  Spinal+ block    Additional requests/questions:    SignedRebeca Blight   11/12/2023, 5:09 PM

## 2023-11-13 ENCOUNTER — Telehealth: Payer: Self-pay | Admitting: Internal Medicine

## 2023-11-13 ENCOUNTER — Telehealth (HOSPITAL_BASED_OUTPATIENT_CLINIC_OR_DEPARTMENT_OTHER): Payer: Self-pay | Admitting: *Deleted

## 2023-11-13 NOTE — Telephone Encounter (Signed)
 D/w the preop APP Lamarr Satterfield, DNP who said no tele appt is needed. Pt can see Dr. Mona for preop clearance.

## 2023-11-13 NOTE — Telephone Encounter (Signed)
 Discussed further with preop APP as to pt will still need tele, as we discovered the appt with Dr. Mona is with [facility].   Pt tells me the surgery will be in 02/2024. I confirmed with preop APP Lamarr L. DNP if tele appt still ok.   Per APP: Yes, lasts 3 months   Med rec and consent are done.     Patient Consent for Virtual Visit        Kathleen Meyers has provided verbal consent on 11/13/2023 for a virtual visit (video or telephone).   CONSENT FOR VIRTUAL VISIT FOR:  Kathleen Meyers  By participating in this virtual visit I agree to the following:  I hereby voluntarily request, consent and authorize [facility] and its employed or contracted physicians, physician assistants, nurse practitioners or other licensed health care professionals (the Practitioner), to provide me with telemedicine health care services (the "Services) as deemed necessary by the treating Practitioner. I acknowledge and consent to receive the Services by the Practitioner via telemedicine. I understand that the telemedicine visit will involve communicating with the Practitioner through live audiovisual communication technology and the disclosure of certain medical information by electronic transmission. I acknowledge that I have been given the opportunity to request an in-person assessment or other available alternative prior to the telemedicine visit and am voluntarily participating in the telemedicine visit.  I understand that I have the right to withhold or withdraw my consent to the use of telemedicine in the course of my care at any time, without affecting my right to future care or treatment, and that the Practitioner or I may terminate the telemedicine visit at any time. I understand that I have the right to inspect all information obtained and/or recorded in the course of the telemedicine visit and may receive copies of available information for a reasonable fee.  I understand that some of the  potential risks of receiving the Services via telemedicine include:  Delay or interruption in medical evaluation due to technological equipment failure or disruption; Information transmitted may not be sufficient (e.g. poor resolution of images) to allow for appropriate medical decision making by the Practitioner; and/or  In rare instances, security protocols could fail, causing a breach of personal health information.  Furthermore, I acknowledge that it is my responsibility to provide information about my medical history, conditions and care that is complete and accurate to the best of my ability. I acknowledge that Practitioner's advice, recommendations, and/or decision may be based on factors not within their control, such as incomplete or inaccurate data provided by me or distortions of diagnostic images or specimens that may result from electronic transmissions. I understand that the practice of medicine is not an exact science and that Practitioner makes no warranties or guarantees regarding treatment outcomes. I acknowledge that a copy of this consent can be made available to me via my patient portal [facility] MyChart), or I can request a printed copy by calling the office of  [facility].    I understand that my insurance will be billed for this visit.   I have read or had this consent read to me. I understand the contents of this consent, which adequately explains the benefits and risks of the Services being provided via telemedicine.  I have been provided ample opportunity to ask questions regarding this consent and the Services and have had my questions answered to my satisfaction. I give my informed consent for the services to be provided through  the use of telemedicine in my medical care

## 2023-11-13 NOTE — Telephone Encounter (Signed)
-----   Message from Vinie JAYSON Maxcy sent at 11/13/2023 11:50 AM EDT ----- Regarding: RE: question Yes, lets update the lipid to see if we still need to pursue more therapy  Dr VEAR ----- Message ----- From: Loring Andriette HERO, RN Sent: 11/13/2023  11:43 AM EDT To: Vinie JAYSON Maxcy, MD Subject: FW: question                                   Please advise if labs are needed. Was seen by you 06/2023. See below for testing that has been completed.    Vinie JAYSON Maxcy, MD 06/17/2023 12:10 PM EDT   Yes .SABRA I think she will need additional lipid-lowering therapy on top of her statin. LP(a) is negative - based on insurance, Leqvio would be a good option to lower LDL by another 50% and reach target LDL <70. She has her upcoming PET test tomorrow.  We can discuss when giving her the results of that.  Dr. Maxcy Vinie JAYSON Maxcy, MD 06/12/2023  8:21 AM EDT   LP(a) negative  Dr VEAR Vinie JAYSON Maxcy, MD 06/18/2023  1:16 PM EDT   Normal cardiac PET - no ischemia. LVEF 55%.  Low risk. This suggests exertional fatigue and dyspnea is not due to a blocked coronary artery.  Dr VEAR ----- Message ----- From: Wynetta Niels HERO, CMA Sent: 11/13/2023  11:26 AM EDT To: Andriette HERO Loring, RN Subject: question                                       Hi Magaly Pollina,   I was speaking with the pt in regard to preop appt when she had some questions about the appt for the Lipid clinic with Dr. Maxcy 11/26/23.  She said she is not sure how it all works. She said she thought she was supposed to get labs a few days before.  I assured the pt that I will d/w you and have you reach out to her to help answer her questions.   Thank you  Niels

## 2023-11-13 NOTE — Telephone Encounter (Signed)
Message sent to MyChart.

## 2023-11-13 NOTE — Telephone Encounter (Signed)
 Discussed further with preop APP as to pt will still need tele, as we discovered the appt with Dr. Mona is with the [facility].   Pt tells me the surgery will be in 02/2024. I confirmed with preop APP Lamarr L. DNP if tele appt still ok.   Per APP: Yes, lasts 3 months   Med rec and consent are done.

## 2023-11-13 NOTE — Telephone Encounter (Signed)
   Name: Kathleen Meyers  DOB: May 10, 1955  MRN: [id number]  Primary Cardiologist: None   Preoperative team, please contact this patient and set up a phone call appointment for further preoperative risk assessment. Please obtain consent and complete medication review. Thank you for your help.  I confirm that guidance regarding antiplatelet and oral anticoagulation therapy has been completed and, if necessary, noted below.  Patient is not on anticoagulation or antiplatelet per review of medical record in Epic.    I also confirmed the patient resides in the state of [place] . As per [facility] Medical Board telemedicine laws, the patient must reside in the state in which the provider is licensed.   Lamarr Satterfield, NP 11/13/2023, 10:57 AM [facility]

## 2023-11-16 LAB — NMR, LIPOPROFILE
Cholesterol, Total: 196 mg/dL (ref 100–199)
HDL Particle Number: 36.6 umol/L (ref >=30.5)
HDL-C: 47 mg/dL (ref >39)
LDL Particle Number: 1512 nmol/L — ABNORMAL HIGH (ref <1000)
LDL Size: 20.5 nm — ABNORMAL LOW (ref >20.5)
LDL-C (NIH Calc): 125 mg/dL — ABNORMAL HIGH (ref 0–99)
LP-IR Score: 76 — ABNORMAL HIGH (ref <=45)
Small LDL Particle Number: 741 nmol/L — ABNORMAL HIGH (ref <=527)
Triglycerides: 134 mg/dL (ref 0–149)

## 2023-11-18 ENCOUNTER — Ambulatory Visit: Admitting: Physician Assistant

## 2023-11-22 NOTE — Therapy (Signed)
 OUTPATIENT PHYSICAL THERAPY LOWER EXTREMITY EVALUATION   Patient Name: Kathleen Meyers MRN: [id number] DOB:1955/12/30, 68 y.o., female Today's Date: 11/27/2023   END OF SESSION:  PT End of Session - 11/27/23 0804     Visit Number 1    Date for Recertification  01/22/24    Authorization Type Medicare & AARP    Progress Note Due on Visit 10    PT Start Time 0804    PT Stop Time 0853    PT Time Calculation (min) 49 min    Activity Tolerance Patient tolerated treatment well    Behavior During Therapy [facility] for tasks assessed/performed          History reviewed. No pertinent past medical history. Past Surgical History:  Procedure Laterality Date   BREAST CYST ASPIRATION Left    DILATION AND CURETTAGE OF UTERUS     ENT surgery as a child      HERNIA REPAIR     umbilical as a child    infertility surgery      LAPAROSCOPIC ASSISTED VAGINAL HYSTERECTOMY N/A 08/05/2014   Procedure: LAPAROSCOPIC ASSISTED VAGINAL HYSTERECTOMY;  Surgeon: Truman Corona, MD;  Location: [facility];  Service: Gynecology;  Laterality: N/A;   SALPINGOOPHORECTOMY Bilateral 08/05/2014   Procedure: BILATERAL SALPINGO OOPHORECTOMY;  Surgeon: Truman Corona, MD;  Location: [facility];  Service: Gynecology;  Laterality: Bilateral;   Patient Active Problem List   Diagnosis Date Noted   Unilateral primary osteoarthritis, left knee 04/03/2023   Fibroids 08/05/2014   Bunion 11/12/2006   FOOT PAIN, RIGHT 11/12/2006   MUSCLE STRAIN, LEFT BUTTOCK 07/16/2006    PCP: Chrystal Lamarr RAMAN, MD   REFERRING PROVIDER: Gretta Bertrum ORN, PA-C   REFERRING DIAG: [phone] (ICD-10-CM) - Chronic pain of left knee   THERAPY DIAG:  Chronic pain of left knee  Stiffness of left knee, not elsewhere classified  Muscle weakness (generalized)  Difficulty in walking, not elsewhere classified  Localized edema  Other muscle spasm  RATIONALE FOR EVALUATION AND TREATMENT: Rehabilitation  ONSET DATE: Chronic   NEXT MD  VISIT: 12/11/2023 with Lonni Poli, MD   SUBJECTIVE:                                                                                                                                                                                                         SUBJECTIVE STATEMENT: Pt reports advanced OA in her L knee for which she is planning to have TKA in early January 2026.  She requested PT now to help with her walking, esp up stairs, and make  sure she is doing the right exercises to prepare for surgery.  She feels like she is now starting to have more nervy pain in her thigh. Also has been noting more of a feeling of the knee wanting to give way while walking which will stop her in tracks.  PAIN: Are you having pain? Yes: NPRS scale: 3/10  Pain location: L knee  Pain description: achy, tense when walking  Aggravating factors: walking, stairs Relieving factors: Aleve or Tylenol , elevation   PERTINENT HISTORY:  osteoarthritis - scheduled for L TKA in early January 2026, bunion surgery  PRECAUTIONS: None  RED FLAGS: None  WEIGHT BEARING RESTRICTIONS: No  FALLS:  Has patient fallen in last 6 months? Yes. Number of falls 1 in past 6 months, 2 in past year - both tripping over something  LIVING ENVIRONMENT: Lives with: lives with their spouse Lives in: House Stairs: Yes: Internal: 14 steps; on right going up and External: 4 steps; bilateral but cannot reach both Has following equipment at home: Crutches  OCCUPATION: FT - Marketing - mostly deskwork  PLOF: Independent and Leisure: walking, riding bike, exercise 3-4x/wk   PATIENT GOALS: To get stronger before TKA surgery.   OBJECTIVE: (objective measures completed at initial evaluation unless otherwise dated)  DIAGNOSTIC FINDINGS:  11/11/23 - XR L knee: Left knee: Tricompartmental arthritis.  No acute fractures.  Near  bone-on-bone medial compartment.  Severe patellofemoral changes with large  osteophytes off the  superior and inferior inferior poles.  Knee is well  located.  No acute fractures.   PATIENT SURVEYS:  LEFS  Extreme difficulty/unable (0), Quite a bit of difficulty (1), Moderate difficulty (2), Little difficulty (3), No difficulty (4) Survey date:  11/27/23  Any of your usual work, housework or school activities 4  2. Usual hobbies, recreational or sporting activities 4  3. Getting into/out of the bath 4  4. Walking between rooms 4  5. Putting on socks/shoes 3  6. Squatting  3  7. Lifting an object, like a bag of groceries from the floor 3  8. Performing light activities around your home 4  9. Performing heavy activities around your home 3  10. Getting into/out of a car 4  11. Walking 2 blocks 3  12. Walking 1 mile 3  13. Going up/down 10 stairs (1 flight) 3  14. Standing for 1 hour 3  15.  sitting for 1 hour 3  16. Running on even ground 2  17. Running on uneven ground 2  18. Making sharp turns while running fast 0  19. Hopping  3  20. Rolling over in bed 4  Score total:  62 / 80 = 77.5 %  Functional limitation: Minimal    COGNITION: Overall cognitive status: Within functional limits for tasks assessed    SENSATION: WFL  EDEMA:  Circumferential:   R: mid patella - 39.0 cm L: mid patella - 40.5 cm  POSTURE:  L knee slightly flexed in stance  PALPATION: Limited patellar mobility L>R  Increased muscle tension in L lateral quad with TPs mid VL  MUSCLE LENGTH: Hamstrings: mod tight L, mild tight R ITB: mod tight L, mild tight R Piriformis: mod tight B Hip flexors: mod/severe tight L, mod tight R Quads: mod/severe tight L, mod tight R Heelcord: WFL  LOWER EXTREMITY ROM:  A/PROM Right eval Left eval  Knee flexion 139 106  Knee extension 0 / -2 hyperextension when supported 8 LAQ / 3 supported  (Blank rows = not tested)  LOWER EXTREMITY MMT:  MMT Right eval Left eval  Hip flexion 4+ 4  Hip extension 4+ 4+  Hip abduction 5 4+  Hip adduction 4+ 4  Hip  internal rotation 5 4+  Hip external rotation 4+ 4  Knee flexion 5 4+  Knee extension 5 4+  Ankle dorsiflexion 4+ 4+  Ankle plantarflexion    Ankle inversion    Ankle eversion     (Blank rows = not tested)  FUNCTIONAL TESTS: TBA next visit 5 times sit to stand:   Timed up and go (TUG):   10 meter walk test:    GAIT: Distance walked: Clinic distances Assistive device utilized: None Level of assistance: Complete Independence Gait pattern: decreased stance time- Left, knee flexed in stance- Left, and antalgic   TODAY'S TREATMENT:   11/27/2023 - Eval SELF CARE:  Reviewed eval findings and role of PT in addressing identified deficits as well as instruction in initial HEP (see below).    PATIENT EDUCATION:  Education details: PT eval findings, anticipated POC, and initial HEP  Person educated: Patient Education method: Explanation, Demonstration, Verbal cues, and Handouts Education comprehension: verbalized understanding, returned demonstration, verbal cues required, and needs further education  HOME EXERCISE PROGRAM: Access Code: [email or web address] URL: [email or web address] Date: 11/27/2023 Prepared by: Elijah Hidden  Exercises - Seated Hamstring Stretch with Strap  - 2 x daily - 7 x weekly - 3 reps - 30 sec hold - Quadricep Stretch with Chair and Counter Support  - 2 x daily - 7 x weekly - 3 reps - 30 sec hold - Standing Quad Stretch with Strap  - 2 x daily - 7 x weekly - 3 reps - 30 sec hold - Seated Hip Flexor Stretch  - 2 x daily - 7 x weekly - 3 reps - 30 sec hold   ASSESSMENT:  CLINICAL IMPRESSION: COURTNE LIGHTY is a 68 y.o. female who was referred to physical therapy for evaluation and treatment for chronic L knee pain secondary to advanced L knee osteoarthritis.  She is planning to undergo L TKA in early January 2026 and requested PT to help her prepare for surgery.   Patient reports chronic L knee pain, worse with walking and climbing stairs.  Patient has  deficits in L knee ROM, L>R LE flexibility, L>R strength, abnormal posture with L knee slightly flexed in stance, and TTP with abnormal muscle tension and lateral quads and hamstrings which are interfering with ADLs and are impacting quality of life.  On LEFS patient scored 62/80 demonstrating minimal functional limitation.  Harly will benefit from skilled PT to address above deficits to improve mobility and activity tolerance with decreased pain interference.  OBJECTIVE IMPAIRMENTS: Abnormal gait, decreased activity tolerance, decreased balance, decreased knowledge of use of DME, decreased mobility, difficulty walking, decreased ROM, decreased strength, increased edema, increased fascial restrictions, impaired perceived functional ability, increased muscle spasms, impaired flexibility, postural dysfunction, and pain.   ACTIVITY LIMITATIONS: carrying, lifting, sitting, standing, squatting, sleeping, stairs, transfers, and locomotion level  PARTICIPATION LIMITATIONS: cleaning and community activity  PERSONAL FACTORS: Past/current experiences, Time since onset of injury/illness/exacerbation, and 1-2 comorbidities: osteoarthritis, bunion surgery are also affecting patient's functional outcome.   REHAB POTENTIAL: Excellent  CLINICAL DECISION MAKING: Evolving/moderate complexity  EVALUATION COMPLEXITY: Moderate   GOALS: Goals reviewed with patient? Yes  SHORT TERM GOALS: Target date: 12/25/2023  Patient will be independent with initial HEP. Baseline: Initial HEP provided eval Goal status: INITIAL  2.  Patient will report at least 10-25% improvement  in L proximal LE pain to improve QOL. Baseline: 3/10 Goal status: INITIAL  3.  Patient will improve 5x STS time to </= 15 seconds to demonstrate improved functional strength and transfer efficiency. Baseline: TBA Goal status: INITIAL   LONG TERM GOALS: Target date: 01/22/2024   Patient will be independent with advanced/ongoing HEP to  improve outcomes and carryover.  Baseline:  Goal status: INITIAL  2.  Patient will report at least 25-50% improvement in L knee/proximal LE pain to improve QOL. Baseline: 3/10 Goal status: INITIAL  3.  Patient will demonstrate improved L knee AROM to >/= 3-120 deg to allow for normal gait and stair mechanics. Baseline: Refer to above LE ROM table Goal status: INITIAL  4.  Patient will demonstrate improved B LE strength to >/= 4+/5 for improved stability and ease of mobility. Baseline: Refer to above LE MMT table Goal status: INITIAL  5.  Patient will be able to demonstrate proper gait pattern with RW to prepare for post-op mobility following TKA.  Baseline: currently not using AD Goal status: INITIAL  6. Patient will be able to ascend/descend stairs with 1 HR and step-to pattern safely to access home and community after TKA.  Baseline:  Goal status: INITIAL  7.  Patient will report >/= 67/80 on LEFS to demonstrate improved functional ability. Baseline: 62 / 80 = 77.5 % Goal status: INITIAL  8.  Patient will demonstrate at least 19/24 on DGI to decrease risk of falls. Baseline:  Goal status: INITIAL   9.  Patient will verbalize understanding of expected post-op rehab program including initial post-op HEP. Baseline:  Goal status: INITIAL   PLAN:  PT FREQUENCY: 1-2x/week  PT DURATION: 6 weeks  PLANNED INTERVENTIONS: 02835- PT Re-evaluation, 97750- Physical Performance Testing, 97110-Therapeutic exercises, 97530- Therapeutic activity, [id number]- Neuromuscular re-education, 97535- Self Care, 02859- Manual therapy, [phone]- Gait training, [phone]- Aquatic Therapy, [phone]- Electrical stimulation (unattended), 97016- Vasopneumatic device, 97035- Ultrasound, 02966- Ionotophoresis 4mg /ml Dexamethasone , Patient/Family education, Balance training, Stair training, Taping, Joint mobilization, DME instructions, Cryotherapy, and Moist heat  PLAN FOR NEXT SESSION: Complete functional testing -  5xSTS, TUG and ; review initial HEP stretches and modify if necessary; introduce L knee ROM exercises as well as LE strengthening to help prepare for upcoming TKR surgery; provide education in proper use of RW as AD for postop mobility including safe transfer techniques, proper gait pattern and stair negotiation    Elijah CHRISTELLA Hidden, PT 11/27/2023, 1:01 PM

## 2023-11-26 ENCOUNTER — Encounter: Payer: Self-pay | Admitting: Internal Medicine

## 2023-11-26 ENCOUNTER — Ambulatory Visit: Attending: Internal Medicine | Admitting: Internal Medicine

## 2023-11-26 VITALS — BP 118/70 | HR 71 | Ht 63.78 in | Wt 163.8 lb

## 2023-11-26 DIAGNOSIS — I2510 Atherosclerotic heart disease of native coronary artery without angina pectoris: Secondary | ICD-10-CM | POA: Insufficient documentation

## 2023-11-26 DIAGNOSIS — E785 Hyperlipidemia, unspecified: Secondary | ICD-10-CM | POA: Insufficient documentation

## 2023-11-26 DIAGNOSIS — I493 Ventricular premature depolarization: Secondary | ICD-10-CM | POA: Insufficient documentation

## 2023-11-26 NOTE — Patient Instructions (Signed)
 Medication Instructions:  No changes *If you need a refill on your cardiac medications before your next appointment, please call your pharmacy*  Lab Work: NMR lipoprofile- fasting lab work before 4 month follow up appointment If you have labs (blood work) drawn today and your tests are completely normal, you will receive your results only by: MyChart Message (if you have MyChart) OR A paper copy in the mail If you have any lab test that is abnormal or we need to change your treatment, we will call you to review the results.  Testing/Procedures: None ordered  Follow-Up: At [facility], you and your health needs are our priority.  As part of our continuing mission to provide you with exceptional heart care, our providers are all part of one team.  This team includes your primary Cardiologist (physician) and Advanced Practice Providers or APPs (Physician Assistants and Nurse Practitioners) who all work together to provide you with the care you need, when you need it.  Your next appointment:   4 month(s)  Provider:   Dr Mona  We recommend signing up for the patient portal called MyChart.  Sign up information is provided on this After Visit Summary.  MyChart is used to connect with patients for Virtual Visits (Telemedicine).  Patients are able to view lab/test results, encounter notes, upcoming appointments, etc.  Non-urgent messages can be sent to your provider as well.   To learn more about what you can do with MyChart, go to [email or web address].

## 2023-11-26 NOTE — Addendum Note (Signed)
 Addended by: MONA VINIE BROCKS on: 11/26/2023 09:01 AM   Modules accepted: Orders

## 2023-11-26 NOTE — Progress Notes (Signed)
 LIPID CLINIC CONSULT NOTE  Chief Complaint:  Manage dyslipidemia, coronary artery calcification, fatigue  Primary Care Physician: Chrystal Lamarr RAMAN, MD  Primary Cardiologist:  None  HPI:  Kathleen Meyers is a 68 y.o. female who is being seen today for the evaluation of dyslipidemia, coronary artery calcification at the request of Chrystal Lamarr RAMAN, *.  This is a pleasant 68 year old female, referred for evaluation management of dyslipidemia.  She was found to have a high calcium  score in December 2024, which showed a calcium  score of 254, 87 percentile for age and sex matched controls.  Aortic atherosclerosis was also noted.  Importantly there was a calcium  score of 114 in the left main with some calcium  in the LAD, circumflex and right coronary.  When asked today if she was having any symptoms with this she did report that recently she has noticed that she is more short of breath.  She says she has been a runner and has not been able to do that for some time but does exercise on the Pel[employer] bike.  She gets somewhat either short of breath or fatigue and is not able to go the amount of exercise that she thought she should be able to do.  Other risk factors include hypertension.  She is on atorvastatin  40 mg daily.  Lipids in February showed total cholesterol 203, triglycerides 196, HDL 53 and LDL 116.  Her target LDL should be less than 70.  She does report heart disease in her family.  11/26/2023  Ms. Michie returns today for follow-up.  She underwent cardiac PET scanning for progressive dyspnea and known coronary artery calcification.  This demonstrated no reversible ischemia however PVCs were noted.  LVEF at rest and stress was 55%.  There was normal myocardial blood flow reserve.  Coronary artery calcifications were again noted as well as aortic atherosclerosis and aortic valve calcification and pulmonary artery dilatation.  Since this study however she says she feels much better.   She has been exercising more and notes that her symptoms have all resolved.  Blood pressure is good today 118/70.  She is interested in proceeding with knee surgery which should be scheduled in January with Dr. Vernetta.  Her lipids repeated recently do show persistent elevation.  Total cholesterol 196, HDL 47, triglycerides 134 and LDL 125.  Her goal LDL is less than 70.  We discussed potential additional therapies as she is currently on 40 mg atorvastatin  daily.  She has good insurance coverage for Leqvio which may be an option however she feels that she might make more inroads with dietary changes.  PMHx:   History reviewed. No pertinent past medical history.  Past Surgical History:  Procedure Laterality Date   BREAST CYST ASPIRATION Left    DILATION AND CURETTAGE OF UTERUS     ENT surgery as a child      HERNIA REPAIR     umbilical as a child    infertility surgery      LAPAROSCOPIC ASSISTED VAGINAL HYSTERECTOMY N/A 08/05/2014   Procedure: LAPAROSCOPIC ASSISTED VAGINAL HYSTERECTOMY;  Surgeon: Truman Corona, MD;  Location: [facility];  Service: Gynecology;  Laterality: N/A;   SALPINGOOPHORECTOMY Bilateral 08/05/2014   Procedure: BILATERAL SALPINGO OOPHORECTOMY;  Surgeon: Truman Corona, MD;  Location: [facility];  Service: Gynecology;  Laterality: Bilateral;    FAMHx:  Family History  Problem Relation Age of Onset   Breast cancer Sister 56    SOCHx:   reports that she has never smoked. She  has never used smokeless tobacco. She reports that she does not drink alcohol and does not use drugs.  ALLERGIES:  No Known Allergies  ROS: Pertinent items noted in HPI and remainder of comprehensive ROS otherwise negative.  HOME MEDS: Current Outpatient Medications on File Prior to Visit  Medication Sig Dispense Refill   amLODipine  (NORVASC ) 5 MG tablet Take 5 mg by mouth daily.     atorvastatin  (LIPITOR) 20 MG tablet Take 20 mg by mouth daily.     BABY ASPIRIN PO Take 81 mg by mouth daily.      therapeutic multivitamin-minerals (THERAGRAN-M) tablet Take 1 tablet by mouth daily.     No current facility-administered medications on file prior to visit.    LABS/IMAGING: No results found for this or any previous visit (from the past 48 hours). No results found.  LIPID PANEL: No results found for: CHOL, TRIG, HDL, CHOLHDL, VLDL, LDLCALC, LDLDIRECT  WEIGHTS: Wt Readings from Last 3 Encounters:  11/26/23 163 lb 12.8 oz (74.3 kg)  10/02/23 171 lb 6.4 oz (77.7 kg)  06/07/23 169 lb (76.7 kg)    VITALS: BP 118/70   Pulse 71   Ht 5' 3.78 (1.62 m)   Wt 163 lb 12.8 oz (74.3 kg)   SpO2 98%   BMI 28.31 kg/m   EXAM: Deferred  EKG: Deferred  ASSESSMENT: Exertional fatigue and dyspnea -low risk cardiac PET with normal perfusion, LVEF 55% (06/2023) Multivessel coronary artery calcification including left main, CAC score 254, 87th percentile (01/2023) Aortic atherosclerosis and aortic valve calcification Dyslipidemia, goal LDL less than 70 PVCs, asymptomatic  PLAN: 1.   Ms. Helm notes improvement in her symptoms of dyspnea and fatigue with exertion.  Cholesterol remains elevated.  I think she is a good candidate to add additional therapy such as Leqvio since she is already on high-dose statin.  She wants to work on diet and lifestyle and has an upcoming knee surgery.  From a cardiac standpoint she is cleared to proceed with surgery.  Will plan repeat lipids in about 4 months which is after her surgery at which time we might consider additional therapeutic options if she remains above the target.  She is agreeable with this plan.  Vinie KYM Maxcy, MD, [facility], FNLA, FACP  [place]  [facility]  Medical Director of the Advanced Lipid Disorders &  Cardiovascular Risk Reduction Clinic Diplomate of the American Board of Clinical Lipidology Attending Cardiologist  Direct Dial: [phone]  Fax: [phone]  Website:  [email or web address] Vinie BROCKS  Even [place] 11/26/2023, 8:56 AM

## 2023-11-27 ENCOUNTER — Encounter: Payer: Self-pay | Admitting: Physical Therapy

## 2023-11-27 ENCOUNTER — Ambulatory Visit: Attending: Physician Assistant | Admitting: Physical Therapy

## 2023-11-27 ENCOUNTER — Other Ambulatory Visit: Payer: Self-pay

## 2023-11-27 DIAGNOSIS — R262 Difficulty in walking, not elsewhere classified: Secondary | ICD-10-CM | POA: Insufficient documentation

## 2023-11-27 DIAGNOSIS — R600 Localized edema: Secondary | ICD-10-CM | POA: Insufficient documentation

## 2023-11-27 DIAGNOSIS — G8929 Other chronic pain: Secondary | ICD-10-CM | POA: Insufficient documentation

## 2023-11-27 DIAGNOSIS — M6281 Muscle weakness (generalized): Secondary | ICD-10-CM | POA: Diagnosis present

## 2023-11-27 DIAGNOSIS — M25562 Pain in left knee: Secondary | ICD-10-CM | POA: Insufficient documentation

## 2023-11-27 DIAGNOSIS — M62838 Other muscle spasm: Secondary | ICD-10-CM | POA: Diagnosis present

## 2023-11-27 DIAGNOSIS — M25662 Stiffness of left knee, not elsewhere classified: Secondary | ICD-10-CM | POA: Insufficient documentation

## 2023-12-05 ENCOUNTER — Ambulatory Visit

## 2023-12-09 ENCOUNTER — Encounter: Payer: Self-pay | Admitting: Radiology

## 2023-12-10 ENCOUNTER — Ambulatory Visit: Attending: Physician Assistant | Admitting: Physical Therapy

## 2023-12-10 ENCOUNTER — Encounter: Payer: Self-pay | Admitting: Physical Therapy

## 2023-12-10 DIAGNOSIS — R600 Localized edema: Secondary | ICD-10-CM | POA: Diagnosis present

## 2023-12-10 DIAGNOSIS — G8929 Other chronic pain: Secondary | ICD-10-CM | POA: Diagnosis present

## 2023-12-10 DIAGNOSIS — M62838 Other muscle spasm: Secondary | ICD-10-CM | POA: Diagnosis present

## 2023-12-10 DIAGNOSIS — M6281 Muscle weakness (generalized): Secondary | ICD-10-CM | POA: Diagnosis present

## 2023-12-10 DIAGNOSIS — M25662 Stiffness of left knee, not elsewhere classified: Secondary | ICD-10-CM | POA: Diagnosis present

## 2023-12-10 DIAGNOSIS — R262 Difficulty in walking, not elsewhere classified: Secondary | ICD-10-CM | POA: Insufficient documentation

## 2023-12-10 DIAGNOSIS — M25562 Pain in left knee: Secondary | ICD-10-CM | POA: Insufficient documentation

## 2023-12-10 NOTE — Therapy (Addendum)
 OUTPATIENT PHYSICAL THERAPY TREATMENT   Patient Name: GLENA PHARRIS MRN: [id number] DOB:[birth year], 68 y.o., female Today's Date: 12/10/2023   END OF SESSION:  PT End of Session - 12/10/23 0931     Visit Number 2    Date for Recertification  01/22/24    Authorization Type Medicare & AARP    Progress Note Due on Visit 10    PT Start Time 0931    PT Stop Time 1021    PT Time Calculation (min) 50 min    Activity Tolerance Patient tolerated treatment well    Behavior During Therapy [facility] for tasks assessed/performed           History reviewed. No pertinent past medical history. Past Surgical History:  Procedure Laterality Date   BREAST CYST ASPIRATION Left    DILATION AND CURETTAGE OF UTERUS     ENT surgery as a child      HERNIA REPAIR     umbilical as a child    infertility surgery      LAPAROSCOPIC ASSISTED VAGINAL HYSTERECTOMY N/A 08/05/2014   Procedure: LAPAROSCOPIC ASSISTED VAGINAL HYSTERECTOMY;  Surgeon: Truman Corona, MD;  Location: [facility];  Service: Gynecology;  Laterality: N/A;   SALPINGOOPHORECTOMY Bilateral 08/05/2014   Procedure: BILATERAL SALPINGO OOPHORECTOMY;  Surgeon: Truman Corona, MD;  Location: [facility];  Service: Gynecology;  Laterality: Bilateral;   Patient Active Problem List   Diagnosis Date Noted   Unilateral primary osteoarthritis, left knee 04/03/2023   Fibroids 08/05/2014   Bunion 11/12/2006   FOOT PAIN, RIGHT 11/12/2006   MUSCLE STRAIN, LEFT BUTTOCK 07/16/2006    PCP: Chrystal Lamarr RAMAN, MD   REFERRING PROVIDER: Gretta Bertrum ORN, PA-C   REFERRING DIAG: [phone] (ICD-10-CM) - Chronic pain of left knee   THERAPY DIAG:  Chronic pain of left knee  Stiffness of left knee, not elsewhere classified  Muscle weakness (generalized)  Difficulty in walking, not elsewhere classified  Localized edema  Other muscle spasm  RATIONALE FOR EVALUATION AND TREATMENT: Rehabilitation  ONSET DATE: Chronic   NEXT MD VISIT: 12/11/2023  with Lonni Poli, MD   SUBJECTIVE:                                                                                                                                                                                                         SUBJECTIVE STATEMENT: Armelia reports good compliance with her initial HEP but feels like she could benefit from a review today.  EVAL: Pt reports advanced OA in her L knee for which she is planning to have TKA  in early January 2026.  She requested PT now to help with her walking, esp up stairs, and make sure she is doing the right exercises to prepare for surgery.  She feels like she is now starting to have more nervy pain in her thigh. Also has been noting more of a feeling of the knee wanting to give way while walking which will stop her in tracks.  PAIN: Are you having pain? Yes: NPRS scale: 3/10  Pain location: L knee  Pain description: achy, tense when walking  Aggravating factors: walking, stairs Relieving factors: Aleve or Tylenol , elevation   PERTINENT HISTORY:  osteoarthritis - scheduled for L TKA in early January 2026, bunion surgery  PRECAUTIONS: None  RED FLAGS: None  WEIGHT BEARING RESTRICTIONS: No  FALLS:  Has patient fallen in last 6 months? Yes. Number of falls 1 in past 6 months, 2 in past year - both tripping over something  LIVING ENVIRONMENT: Lives with: lives with their spouse Lives in: House Stairs: Yes: Internal: 14 steps; on right going up and External: 4 steps; bilateral but cannot reach both Has following equipment at home: Crutches  OCCUPATION: FT - Marketing - mostly deskwork  PLOF: Independent and Leisure: walking, riding bike, exercise 3-4x/wk   PATIENT GOALS: To get stronger before TKA surgery.   OBJECTIVE: (objective measures completed at initial evaluation unless otherwise dated)  DIAGNOSTIC FINDINGS:  11/11/23 - XR L knee: Left knee: Tricompartmental arthritis.  No acute fractures.  Near   bone-on-bone medial compartment.  Severe patellofemoral changes with large  osteophytes off the superior and inferior inferior poles.  Knee is well  located.  No acute fractures.   PATIENT SURVEYS:  LEFS  Extreme difficulty/unable (0), Quite a bit of difficulty (1), Moderate difficulty (2), Little difficulty (3), No difficulty (4) Survey date:  11/27/23  Any of your usual work, housework or school activities 4  2. Usual hobbies, recreational or sporting activities 4  3. Getting into/out of the bath 4  4. Walking between rooms 4  5. Putting on socks/shoes 3  6. Squatting  3  7. Lifting an object, like a bag of groceries from the floor 3  8. Performing light activities around your home 4  9. Performing heavy activities around your home 3  10. Getting into/out of a car 4  11. Walking 2 blocks 3  12. Walking 1 mile 3  13. Going up/down 10 stairs (1 flight) 3  14. Standing for 1 hour 3  15.  sitting for 1 hour 3  16. Running on even ground 2  17. Running on uneven ground 2  18. Making sharp turns while running fast 0  19. Hopping  3  20. Rolling over in bed 4  Score total:  62 / 80 = 77.5 %  Functional limitation: Minimal    COGNITION: Overall cognitive status: Within functional limits for tasks assessed    SENSATION: WFL  EDEMA:  Circumferential:   R: mid patella - 39.0 cm L: mid patella - 40.5 cm  POSTURE:  L knee slightly flexed in stance  PALPATION: Limited patellar mobility L>R  Increased muscle tension in L lateral quad with TPs mid VL  MUSCLE LENGTH: Hamstrings: mod tight L, mild tight R ITB: mod tight L, mild tight R Piriformis: mod tight B Hip flexors: mod/severe tight L, mod tight R Quads: mod/severe tight L, mod tight R Heelcord: WFL  LOWER EXTREMITY ROM:  A/PROM Right eval Left eval  Knee flexion 139 106  Knee extension 0 / -2 hyperextension when supported 8 LAQ / 3 supported  (Blank rows = not tested)  LOWER EXTREMITY MMT:  MMT Right eval  Left eval  Hip flexion 4+ 4  Hip extension 4+ 4+  Hip abduction 5 4+  Hip adduction 4+ 4  Hip internal rotation 5 4+  Hip external rotation 4+ 4  Knee flexion 5 4+  Knee extension 5 4+  Ankle dorsiflexion 4+ 4+  Ankle plantarflexion    Ankle inversion    Ankle eversion     (Blank rows = not tested)  FUNCTIONAL TESTS: (12/10/23) 5 times sit to stand: 11.56 sec  Timed up and go (TUG): 7.31 sec  10 meter walk test: 6.34 sec  Gait speed: 5.17 ft/sec  GAIT: Distance walked: Clinic distances Assistive device utilized: None Level of assistance: Complete Independence Gait pattern: decreased stance time- Left, knee flexed in stance- Left, and antalgic   TODAY'S TREATMENT:   12/10/2023  THERAPEUTIC EXERCISE: To improve strength, endurance, ROM, and flexibility.  Demonstration, verbal and tactile cues throughout for technique.  Rec Bike - L3 x 4' (seat #5) - stopped early due to cramping in R HS Seated HS stretch with strap + DF lift for calf stretch 3 x 30 bil Standing quad stretch with strap 3 x 30 L 1/2 kneel lunge hip flexor stretch 2 x 30 Standing hip flexor stretch with lower leg supported on chair x 30 Seated lunge position hip flexor stretch x 30 Seated L knee flexion/extension heel slides with foot on sliding disc x 10 Seated L LAQ 3# 2 x 10 Hooklying L quad set + SLR 3# 2 x 10 L quad set with towel roll under heel 10 x 5 Supine L heel slide with strap to increase L knee flexion x 10 - patient noting difficulty initiating straightening of knee after full flexion   11/27/2023 - Eval SELF CARE:  Reviewed eval findings and role of PT in addressing identified deficits as well as instruction in initial HEP (see below).    PATIENT EDUCATION:  Education details: PT eval findings, anticipated POC, and initial HEP  Person educated: Patient Education method: Explanation, Demonstration, Verbal cues, and Handouts Education comprehension: verbalized understanding, returned  demonstration, verbal cues required, and needs further education  HOME EXERCISE PROGRAM: Access Code: [email or web address] URL: [email or web address] Date: 12/10/2023 Prepared by: Elijah Hidden  Exercises - Seated Hamstring Stretch with Strap  - 2 x daily - 7 x weekly - 3 reps - 30 sec hold - Standing Quad Stretch with Strap  - 2 x daily - 7 x weekly - 3 reps - 30 sec hold - Standing Hip Flexor Stretch on Chair  - 2 x daily - 7 x weekly - 3 reps - 30 sec hold - Quad Setting and Stretching  - 1 x daily - 7 x weekly - 2 sets - 10 reps - 3-5 sec hold - Active Straight Leg Raise with Quad Set  - 1 x daily - 7 x weekly - 2 sets - 10 reps - 3-5 sec hold - Seated Long Arc Quad with Ankle Weight  - 1 x daily - 7 x weekly - 2 sets - 10 reps - 3 sec hold - Supine Heel Slide with Strap  - 1 x daily - 7 x weekly - 2 sets - 10 reps - 3 sec hold   ASSESSMENT:  CLINICAL IMPRESSION: Reviewed initial HEP providing clarification of positioning and technique for stretches and offering alternatives to better isolate  quad and hip flexor stretching with HEP updated accordingly.  Initiated L knee AA/AROM for L knee flexion as well as quad sets and SLR to promote improved L knee extension, adding these to HEP as well.  Functional testing with 5xSTS, TUG and all demonstrating results WNL.  Will plan to progress proximal LE and functional strengthening in upcoming visits to continue to help patient prepare for upcoming TKA.  Tariyah will benefit from continued skilled PT to address ongoing flexibility/ROM and strength deficits to improve mobility and activity tolerance with decreased pain interference in preparation for planned TKA in early January.   EVAL: LAYCI STENGLEIN is a 68 y.o. female who was referred to physical therapy for evaluation and treatment for chronic L knee pain secondary to advanced L knee osteoarthritis.  She is planning to undergo L TKA in early January 2026 and requested PT to help her  prepare for surgery.   Patient reports chronic L knee pain, worse with walking and climbing stairs.  Patient has deficits in L knee ROM, L>R LE flexibility, L>R strength, abnormal posture with L knee slightly flexed in stance, and TTP with abnormal muscle tension and lateral quads and hamstrings which are interfering with ADLs and are impacting quality of life.  On LEFS patient scored 62/80 demonstrating minimal functional limitation.  Tyashia will benefit from skilled PT to address above deficits to improve mobility and activity tolerance with decreased pain interference.  OBJECTIVE IMPAIRMENTS: Abnormal gait, decreased activity tolerance, decreased balance, decreased knowledge of use of DME, decreased mobility, difficulty walking, decreased ROM, decreased strength, increased edema, increased fascial restrictions, impaired perceived functional ability, increased muscle spasms, impaired flexibility, postural dysfunction, and pain.   ACTIVITY LIMITATIONS: carrying, lifting, sitting, standing, squatting, sleeping, stairs, transfers, and locomotion level  PARTICIPATION LIMITATIONS: cleaning and community activity  PERSONAL FACTORS: Past/current experiences, Time since onset of injury/illness/exacerbation, and 1-2 comorbidities: osteoarthritis, bunion surgery are also affecting patient's functional outcome.   REHAB POTENTIAL: Excellent  CLINICAL DECISION MAKING: Evolving/moderate complexity  EVALUATION COMPLEXITY: Moderate   GOALS: Goals reviewed with patient? Yes  SHORT TERM GOALS: Target date: 12/25/2023  Patient will be independent with initial HEP. Baseline: Initial HEP provided eval Goal status: IN PROGRESS - 12/10/23 - HEP reviewed and updated  2.  Patient will report at least 10-25% improvement in L proximal LE pain to improve QOL. Baseline: 3/10 Goal status: INITIAL  3.  Patient will improve 5x STS time to </= 15 seconds to demonstrate improved functional strength and transfer  efficiency. Baseline: TBA Goal status: MET - 12/10/23 - 11.56 sec   LONG TERM GOALS: Target date: 01/22/2024   Patient will be independent with advanced/ongoing HEP to improve outcomes and carryover.  Baseline:  Goal status: INITIAL  2.  Patient will report at least 25-50% improvement in L knee/proximal LE pain to improve QOL. Baseline: 3/10 Goal status: INITIAL  3.  Patient will demonstrate improved L knee AROM to >/= 3-120 deg to allow for normal gait and stair mechanics. Baseline: Refer to above LE ROM table Goal status: INITIAL  4.  Patient will demonstrate improved B LE strength to >/= 4+/5 for improved stability and ease of mobility. Baseline: Refer to above LE MMT table Goal status: INITIAL  5.  Patient will be able to demonstrate proper gait pattern with RW to prepare for post-op mobility following TKA.  Baseline: currently not using AD Goal status: INITIAL  6. Patient will be able to ascend/descend stairs with 1 HR and step-to pattern  safely to access home and community after TKA.  Baseline:  Goal status: INITIAL  7.  Patient will report >/= 67/80 on LEFS to demonstrate improved functional ability. Baseline: 62 / 80 = 77.5 % Goal status: INITIAL  8.  Patient will demonstrate at least 19/24 on DGI to decrease risk of falls. Baseline:  Goal status: INITIAL   9.  Patient will verbalize understanding of expected post-op rehab program including initial post-op HEP. Baseline:  Goal status: INITIAL   PLAN:  PT FREQUENCY: 1-2x/week  PT DURATION: 6 weeks  PLANNED INTERVENTIONS: 02835- PT Re-evaluation, 97750- Physical Performance Testing, 97110-Therapeutic exercises, 97530- Therapeutic activity, 97112- Neuromuscular re-education, 97535- Self Care, 02859- Manual therapy, [phone]- Gait training, [phone]- Aquatic Therapy, [phone]- Electrical stimulation (unattended), 97016- Vasopneumatic device, 97035- Ultrasound, 02966- Ionotophoresis 4mg /ml Dexamethasone , Patient/Family  education, Balance training, Stair training, Taping, Joint mobilization, DME instructions, Cryotherapy, and Moist heat  PLAN FOR NEXT SESSION: Progress L knee ROM exercises as well as LE strengthening to help prepare for upcoming TKR surgery - reviewing and updating HEP as indicated; provide education in proper use of RW as AD for postop mobility including safe transfer techniques, proper gait pattern and stair negotiation    Elijah CHRISTELLA Hidden, PT 12/10/2023, 7:11 PM

## 2023-12-11 ENCOUNTER — Ambulatory Visit: Admitting: Orthopaedic Surgery

## 2023-12-11 ENCOUNTER — Ambulatory Visit: Admitting: Physical Therapy

## 2023-12-12 ENCOUNTER — Ambulatory Visit

## 2023-12-12 DIAGNOSIS — M25562 Pain in left knee: Secondary | ICD-10-CM | POA: Diagnosis not present

## 2023-12-12 DIAGNOSIS — R600 Localized edema: Secondary | ICD-10-CM

## 2023-12-12 DIAGNOSIS — R262 Difficulty in walking, not elsewhere classified: Secondary | ICD-10-CM

## 2023-12-12 DIAGNOSIS — G8929 Other chronic pain: Secondary | ICD-10-CM

## 2023-12-12 DIAGNOSIS — M25662 Stiffness of left knee, not elsewhere classified: Secondary | ICD-10-CM

## 2023-12-12 DIAGNOSIS — M6281 Muscle weakness (generalized): Secondary | ICD-10-CM

## 2023-12-12 NOTE — Therapy (Signed)
 OUTPATIENT PHYSICAL THERAPY LOWER EXTREMITY TREATMENT   Patient Name: MARGARITE VESSEL MRN: [id number] DOB:May 16, 1955, 68 y.o., female Today's Date: 12/12/2023   END OF SESSION:  PT End of Session - 12/12/23 1017     Visit Number 3    Date for Recertification  01/22/24    Authorization Type Medicare & [employer]    Progress Note Due on Visit 10    PT Start Time 0932    PT Stop Time 1015    PT Time Calculation (min) 43 min    Activity Tolerance Patient tolerated treatment well    Behavior During Therapy [facility] for tasks assessed/performed            History reviewed. No pertinent past medical history. Past Surgical History:  Procedure Laterality Date   BREAST CYST ASPIRATION Left    DILATION AND CURETTAGE OF UTERUS     ENT surgery as a child      HERNIA REPAIR     umbilical as a child    infertility surgery      LAPAROSCOPIC ASSISTED VAGINAL HYSTERECTOMY N/A 08/05/2014   Procedure: LAPAROSCOPIC ASSISTED VAGINAL HYSTERECTOMY;  Surgeon: Truman Corona, MD;  Location: [facility];  Service: Gynecology;  Laterality: N/A;   SALPINGOOPHORECTOMY Bilateral 08/05/2014   Procedure: BILATERAL SALPINGO OOPHORECTOMY;  Surgeon: Truman Corona, MD;  Location: [facility];  Service: Gynecology;  Laterality: Bilateral;   Patient Active Problem List   Diagnosis Date Noted   Unilateral primary osteoarthritis, left knee 04/03/2023   Fibroids 08/05/2014   Bunion 11/12/2006   FOOT PAIN, RIGHT 11/12/2006   MUSCLE STRAIN, LEFT BUTTOCK 07/16/2006    PCP: Chrystal Lamarr RAMAN, MD   REFERRING PROVIDER: Gretta Bertrum ORN, PA-C   REFERRING DIAG: [phone] (ICD-10-CM) - Chronic pain of left knee   THERAPY DIAG:  Chronic pain of left knee  Stiffness of left knee, not elsewhere classified  Muscle weakness (generalized)  Difficulty in walking, not elsewhere classified  Localized edema  RATIONALE FOR EVALUATION AND TREATMENT: Rehabilitation  ONSET DATE: Chronic   NEXT MD VISIT: 12/11/2023 with  Lonni Poli, MD   SUBJECTIVE:                                                                                                                                                                                                         SUBJECTIVE STATEMENT: Janiyha reports feeling it in L quads, reports no issues with HEP  EVAL: Pt reports advanced OA in her L knee for which she is planning to have TKA in early January 2026.  She requested  PT now to help with her walking, esp up stairs, and make sure she is doing the right exercises to prepare for surgery.  She feels like she is now starting to have more nervy pain in her thigh. Also has been noting more of a feeling of the knee wanting to give way while walking which will stop her in tracks.  PAIN: Are you having pain? Yes: NPRS scale: 3/10  Pain location: L knee  Pain description: achy, tense when walking  Aggravating factors: walking, stairs Relieving factors: Aleve or Tylenol , elevation   PERTINENT HISTORY:  osteoarthritis - scheduled for L TKA in early January 2026, bunion surgery  PRECAUTIONS: None  RED FLAGS: None  WEIGHT BEARING RESTRICTIONS: No  FALLS:  Has patient fallen in last 6 months? Yes. Number of falls 1 in past 6 months, 2 in past year - both tripping over something  LIVING ENVIRONMENT: Lives with: lives with their spouse Lives in: House Stairs: Yes: Internal: 14 steps; on right going up and External: 4 steps; bilateral but cannot reach both Has following equipment at home: Crutches  OCCUPATION: FT - Marketing - mostly deskwork  PLOF: Independent and Leisure: walking, riding bike, exercise 3-4x/wk   PATIENT GOALS: To get stronger before TKA surgery.   OBJECTIVE: (objective measures completed at initial evaluation unless otherwise dated)  DIAGNOSTIC FINDINGS:  11/11/23 - XR L knee: Left knee: Tricompartmental arthritis.  No acute fractures.  Near  bone-on-bone medial compartment.  Severe  patellofemoral changes with large  osteophytes off the superior and inferior inferior poles.  Knee is well  located.  No acute fractures.   PATIENT SURVEYS:  LEFS  Extreme difficulty/unable (0), Quite a bit of difficulty (1), Moderate difficulty (2), Little difficulty (3), No difficulty (4) Survey date:  11/27/23  Any of your usual work, housework or school activities 4  2. Usual hobbies, recreational or sporting activities 4  3. Getting into/out of the bath 4  4. Walking between rooms 4  5. Putting on socks/shoes 3  6. Squatting  3  7. Lifting an object, like a bag of groceries from the floor 3  8. Performing light activities around your home 4  9. Performing heavy activities around your home 3  10. Getting into/out of a car 4  11. Walking 2 blocks 3  12. Walking 1 mile 3  13. Going up/down 10 stairs (1 flight) 3  14. Standing for 1 hour 3  15.  sitting for 1 hour 3  16. Running on even ground 2  17. Running on uneven ground 2  18. Making sharp turns while running fast 0  19. Hopping  3  20. Rolling over in bed 4  Score total:  62 / 80 = 77.5 %  Functional limitation: Minimal    COGNITION: Overall cognitive status: Within functional limits for tasks assessed    SENSATION: WFL  EDEMA:  Circumferential:   R: mid patella - 39.0 cm L: mid patella - 40.5 cm  POSTURE:  L knee slightly flexed in stance  PALPATION: Limited patellar mobility L>R  Increased muscle tension in L lateral quad with TPs mid VL  MUSCLE LENGTH: Hamstrings: mod tight L, mild tight R ITB: mod tight L, mild tight R Piriformis: mod tight B Hip flexors: mod/severe tight L, mod tight R Quads: mod/severe tight L, mod tight R Heelcord: WFL  LOWER EXTREMITY ROM:  A/PROM Right eval Left eval  Knee flexion 139 106  Knee extension 0 / -2 hyperextension when  supported 8 LAQ / 3 supported  (Blank rows = not tested)  LOWER EXTREMITY MMT:  MMT Right eval Left eval  Hip flexion 4+ 4  Hip extension  4+ 4+  Hip abduction 5 4+  Hip adduction 4+ 4  Hip internal rotation 5 4+  Hip external rotation 4+ 4  Knee flexion 5 4+  Knee extension 5 4+  Ankle dorsiflexion 4+ 4+  Ankle plantarflexion    Ankle inversion    Ankle eversion     (Blank rows = not tested)  FUNCTIONAL TESTS: (12/10/23) 5 times sit to stand: 11.56 sec  Timed up and go (TUG): 7.31 sec  10 meter walk test: 6.34 sec  Gait speed: 5.17 ft/sec  GAIT: Distance walked: Clinic distances Assistive device utilized: None Level of assistance: Complete Independence Gait pattern: decreased stance time- Left, knee flexed in stance- Left, and antalgic   TODAY'S TREATMENT:  12/12/2023  THERAPEUTIC EXERCISE: To improve strength, endurance, ROM, and flexibility.  Demonstration, verbal and tactile cues throughout for technique.  Rec Bike - L2 x 4' (seat #5) - stopped early due to cramping in R HS Nustep L6x78min- no cramping  Standing L ITB stretch at counter x 30'  MANUAL THERAPY: To promote normalized muscle tension, improve joint mobility and/or for pain modulation  Patellar mobs medial and superior glides STM to R adductors, lateral quads  NEUROMUSCULAR RE-EDUCATION: To improve coordination, kinesthesia, posture, and proprioception Standing 4 way hip RTB anchored x 10 BLE Wall squats 4x10' just above 90 deg  12/10/2023  THERAPEUTIC EXERCISE: To improve strength, endurance, ROM, and flexibility.  Demonstration, verbal and tactile cues throughout for technique.  Rec Bike - L3 x 4' (seat #5) - stopped early due to cramping in R HS Seated HS stretch with strap + DF lift for calf stretch 3 x 30 bil Standing quad stretch with strap 3 x 30 L 1/2 kneel lunge hip flexor stretch 2 x 30 Standing hip flexor stretch with lower leg supported on chair x 30 Seated lunge position hip flexor stretch x 30 Seated L knee flexion/extension heel slides with foot on sliding disc x 10 Seated L LAQ 3# 2 x 10 Hooklying L quad set + SLR 3# 2 x  10 L quad set with towel roll under heel 10 x 5 Supine L heel slide with strap to increase L knee flexion x 10 - patient noting difficulty initiating straightening of knee after full flexion   11/27/2023 - Eval SELF CARE:  Reviewed eval findings and role of PT in addressing identified deficits as well as instruction in initial HEP (see below).    PATIENT EDUCATION:  Education details: PT eval findings, anticipated POC, and initial HEP  Person educated: Patient Education method: Explanation, Demonstration, Verbal cues, and Handouts Education comprehension: verbalized understanding, returned demonstration, verbal cues required, and needs further education  HOME EXERCISE PROGRAM: Access Code: [email or web address] URL: [email or web address] Date: 12/12/2023 Prepared by: Child Campoy  Exercises - Seated Hamstring Stretch with Strap  - 2 x daily - 7 x weekly - 3 reps - 30 sec hold - Standing Quad Stretch with Strap  - 2 x daily - 7 x weekly - 3 reps - 30 sec hold - Standing Hip Flexor Stretch on Chair  - 2 x daily - 7 x weekly - 3 reps - 30 sec hold - Quad Setting and Stretching  - 1 x daily - 7 x weekly - 2 sets - 10 reps - 3-5 sec hold - Active Straight Leg  Raise with Quad Set  - 1 x daily - 7 x weekly - 2 sets - 10 reps - 3-5 sec hold - Seated Long Arc Quad with Ankle Weight  - 1 x daily - 7 x weekly - 2 sets - 10 reps - 3 sec hold - Supine Heel Slide with Strap  - 1 x daily - 7 x weekly - 2 sets - 10 reps - 3 sec hold - Standing Hip Abduction with Anchored Resistance  - 1 x daily - 7 x weekly - 3 sets - 10 reps - Standing Hip Flexion with Anchored Resistance  - 1 x daily - 7 x weekly - 3 sets - 10 reps - Standing Hip Extension and Flexion with Resistance  - 1 x daily - 7 x weekly - 3 sets - 10 reps - Standing Hip Adduction with Anchored Resistance  - 1 x daily - 7 x weekly - 3 sets - 10 reps   ASSESSMENT:  CLINICAL IMPRESSION: Pt responded well to treatment. She showed TTP in  lateral quads and distal adductors. Added standing stabilization and proximal strengthening today with good response. Courtne will benefit from continued skilled PT to address ongoing flexibility/ROM and strength deficits to improve mobility and activity tolerance with decreased pain interference in preparation for planned TKA in early January.   EVAL: ALEIGH GRUNDEN is a 68 y.o. female who was referred to physical therapy for evaluation and treatment for chronic L knee pain secondary to advanced L knee osteoarthritis.  She is planning to undergo L TKA in early January 2026 and requested PT to help her prepare for surgery.   Patient reports chronic L knee pain, worse with walking and climbing stairs.  Patient has deficits in L knee ROM, L>R LE flexibility, L>R strength, abnormal posture with L knee slightly flexed in stance, and TTP with abnormal muscle tension and lateral quads and hamstrings which are interfering with ADLs and are impacting quality of life.  On LEFS patient scored 62/80 demonstrating minimal functional limitation.  Makenna will benefit from skilled PT to address above deficits to improve mobility and activity tolerance with decreased pain interference.  OBJECTIVE IMPAIRMENTS: Abnormal gait, decreased activity tolerance, decreased balance, decreased knowledge of use of DME, decreased mobility, difficulty walking, decreased ROM, decreased strength, increased edema, increased fascial restrictions, impaired perceived functional ability, increased muscle spasms, impaired flexibility, postural dysfunction, and pain.   ACTIVITY LIMITATIONS: carrying, lifting, sitting, standing, squatting, sleeping, stairs, transfers, and locomotion level  PARTICIPATION LIMITATIONS: cleaning and community activity  PERSONAL FACTORS: Past/current experiences, Time since onset of injury/illness/exacerbation, and 1-2 comorbidities: osteoarthritis, bunion surgery are also affecting patient's functional outcome.    REHAB POTENTIAL: Excellent  CLINICAL DECISION MAKING: Evolving/moderate complexity  EVALUATION COMPLEXITY: Moderate   GOALS: Goals reviewed with patient? Yes  SHORT TERM GOALS: Target date: 12/25/2023  Patient will be independent with initial HEP. Baseline: Initial HEP provided eval Goal status: IN PROGRESS - 12/10/23 - HEP reviewed and updated  2.  Patient will report at least 10-25% improvement in L proximal LE pain to improve QOL. Baseline: 3/10 Goal status: INITIAL  3.  Patient will improve 5x STS time to </= 15 seconds to demonstrate improved functional strength and transfer efficiency. Baseline: TBA Goal status: MET - 12/10/23 - 11.56 sec   LONG TERM GOALS: Target date: 01/22/2024   Patient will be independent with advanced/ongoing HEP to improve outcomes and carryover.  Baseline:  Goal status: INITIAL  2.  Patient will report at least  25-50% improvement in L knee/proximal LE pain to improve QOL. Baseline: 3/10 Goal status: INITIAL  3.  Patient will demonstrate improved L knee AROM to >/= 3-120 deg to allow for normal gait and stair mechanics. Baseline: Refer to above LE ROM table Goal status: INITIAL  4.  Patient will demonstrate improved B LE strength to >/= 4+/5 for improved stability and ease of mobility. Baseline: Refer to above LE MMT table Goal status: INITIAL  5.  Patient will be able to demonstrate proper gait pattern with RW to prepare for post-op mobility following TKA.  Baseline: currently not using AD Goal status: INITIAL  6. Patient will be able to ascend/descend stairs with 1 HR and step-to pattern safely to access home and community after TKA.  Baseline:  Goal status: INITIAL  7.  Patient will report >/= 67/80 on LEFS to demonstrate improved functional ability. Baseline: 62 / 80 = 77.5 % Goal status: INITIAL  8.  Patient will demonstrate at least 19/24 on DGI to decrease risk of falls. Baseline:  Goal status: INITIAL   9.  Patient  will verbalize understanding of expected post-op rehab program including initial post-op HEP. Baseline:  Goal status: INITIAL   PLAN:  PT FREQUENCY: 1-2x/week  PT DURATION: 6 weeks  PLANNED INTERVENTIONS: 02835- PT Re-evaluation, 97750- Physical Performance Testing, 97110-Therapeutic exercises, 97530- Therapeutic activity, [id number]- Neuromuscular re-education, 97535- Self Care, 02859- Manual therapy, [phone]- Gait training, [phone]- Aquatic Therapy, [phone]- Electrical stimulation (unattended), [id number]- Vasopneumatic device, 97035- Ultrasound, 02966- Ionotophoresis 4mg /ml Dexamethasone , Patient/Family education, Balance training, Stair training, Taping, Joint mobilization, DME instructions, Cryotherapy, and Moist heat  PLAN FOR NEXT SESSION: Progress L knee ROM exercises as well as LE strengthening to help prepare for upcoming TKR surgery - reviewing and updating HEP as indicated; provide education in proper use of RW as AD for postop mobility including safe transfer techniques, proper gait pattern and stair negotiation    Sol LITTIE Gaskins, PTA 12/12/2023, 10:39 AM

## 2023-12-20 ENCOUNTER — Ambulatory Visit: Admitting: Physical Therapy

## 2023-12-20 ENCOUNTER — Encounter: Payer: Self-pay | Admitting: Physical Therapy

## 2023-12-20 DIAGNOSIS — M25662 Stiffness of left knee, not elsewhere classified: Secondary | ICD-10-CM

## 2023-12-20 DIAGNOSIS — R600 Localized edema: Secondary | ICD-10-CM

## 2023-12-20 DIAGNOSIS — G8929 Other chronic pain: Secondary | ICD-10-CM

## 2023-12-20 DIAGNOSIS — R262 Difficulty in walking, not elsewhere classified: Secondary | ICD-10-CM

## 2023-12-20 DIAGNOSIS — M62838 Other muscle spasm: Secondary | ICD-10-CM

## 2023-12-20 DIAGNOSIS — M6281 Muscle weakness (generalized): Secondary | ICD-10-CM

## 2023-12-20 DIAGNOSIS — M25562 Pain in left knee: Secondary | ICD-10-CM | POA: Diagnosis not present

## 2023-12-20 NOTE — Therapy (Signed)
 OUTPATIENT PHYSICAL THERAPY LOWER EXTREMITY TREATMENT   Patient Name: Kathleen Meyers MRN: [id number] DOB:Oct 14, 1955, 68 y.o., female Today's Date: 12/20/2023   END OF SESSION:  PT End of Session - 12/20/23 0804     Visit Number 4    Date for Recertification  01/22/24    Authorization Type Medicare & AARP    Progress Note Due on Visit 10    PT Start Time 0804    PT Stop Time 0849    PT Time Calculation (min) 45 min    Activity Tolerance Patient tolerated treatment well    Behavior During Therapy [facility] for tasks assessed/performed             History reviewed. No pertinent past medical history. Past Surgical History:  Procedure Laterality Date   BREAST CYST ASPIRATION Left    DILATION AND CURETTAGE OF UTERUS     ENT surgery as a child      HERNIA REPAIR     umbilical as a child    infertility surgery      LAPAROSCOPIC ASSISTED VAGINAL HYSTERECTOMY N/A 08/05/2014   Procedure: LAPAROSCOPIC ASSISTED VAGINAL HYSTERECTOMY;  Surgeon: Truman Corona, MD;  Location: [facility];  Service: Gynecology;  Laterality: N/A;   SALPINGOOPHORECTOMY Bilateral 08/05/2014   Procedure: BILATERAL SALPINGO OOPHORECTOMY;  Surgeon: Truman Corona, MD;  Location: [facility];  Service: Gynecology;  Laterality: Bilateral;   Patient Active Problem List   Diagnosis Date Noted   Unilateral primary osteoarthritis, left knee 04/03/2023   Fibroids 08/05/2014   Bunion 11/12/2006   FOOT PAIN, RIGHT 11/12/2006   MUSCLE STRAIN, LEFT BUTTOCK 07/16/2006    PCP: Chrystal Lamarr RAMAN, MD   REFERRING PROVIDER: Gretta Bertrum ORN, PA-C   REFERRING DIAG: [id number] (ICD-10-CM) - Chronic pain of left knee   THERAPY DIAG:  Chronic pain of left knee  Stiffness of left knee, not elsewhere classified  Muscle weakness (generalized)  Difficulty in walking, not elsewhere classified  Localized edema  Other muscle spasm  RATIONALE FOR EVALUATION AND TREATMENT: Rehabilitation  ONSET DATE: Chronic   NEXT  MD VISIT: 12/11/2023 with Lonni Poli, MD   SUBJECTIVE:                                                                                                                                                                                                         SUBJECTIVE STATEMENT: TKA surgery scheduled for 02/14/2024.  EVAL: Pt reports advanced OA in her L knee for which she is planning to have TKA in early January 2026.  She requested PT now  to help with her walking, esp up stairs, and make sure she is doing the right exercises to prepare for surgery.  She feels like she is now starting to have more nervy pain in her thigh. Also has been noting more of a feeling of the knee wanting to give way while walking which will stop her in tracks.  PAIN: Are you having pain? Yes: NPRS scale: 3/10  Pain location: L knee  Pain description: achy, tense when walking  Aggravating factors: walking, stairs Relieving factors: Aleve or Tylenol , elevation   Are you having pain? Yes: NPRS scale: 4/10 Pain location: R posterior knee, lower thigh and upper calf Pain description: muscle spasm/cramp Aggravating factors: recumbent bike and NuStep Relieving factors: ceasing activity   PERTINENT HISTORY:  osteoarthritis - scheduled for L TKA in early January 2026, bunion surgery  PRECAUTIONS: None  RED FLAGS: None  WEIGHT BEARING RESTRICTIONS: No  FALLS:  Has patient fallen in last 6 months? Yes. Number of falls 1 in past 6 months, 2 in past year - both tripping over something  LIVING ENVIRONMENT: Lives with: lives with their spouse Lives in: House Stairs: Yes: Internal: 14 steps; on right going up and External: 4 steps; bilateral but cannot reach both Has following equipment at home: Crutches  OCCUPATION: FT - Marketing - mostly deskwork  PLOF: Independent and Leisure: walking, riding bike, exercise 3-4x/wk   PATIENT GOALS: To get stronger before TKA surgery.   OBJECTIVE: (objective  measures completed at initial evaluation unless otherwise dated)  DIAGNOSTIC FINDINGS:  11/11/23 - XR L knee: Left knee: Tricompartmental arthritis.  No acute fractures.  Near  bone-on-bone medial compartment.  Severe patellofemoral changes with large  osteophytes off the superior and inferior inferior poles.  Knee is well  located.  No acute fractures.   PATIENT SURVEYS:  LEFS  Extreme difficulty/unable (0), Quite a bit of difficulty (1), Moderate difficulty (2), Little difficulty (3), No difficulty (4) Survey date:  11/27/23  Any of your usual work, housework or school activities 4  2. Usual hobbies, recreational or sporting activities 4  3. Getting into/out of the bath 4  4. Walking between rooms 4  5. Putting on socks/shoes 3  6. Squatting  3  7. Lifting an object, like a bag of groceries from the floor 3  8. Performing light activities around your home 4  9. Performing heavy activities around your home 3  10. Getting into/out of a car 4  11. Walking 2 blocks 3  12. Walking 1 mile 3  13. Going up/down 10 stairs (1 flight) 3  14. Standing for 1 hour 3  15.  sitting for 1 hour 3  16. Running on even ground 2  17. Running on uneven ground 2  18. Making sharp turns while running fast 0  19. Hopping  3  20. Rolling over in bed 4  Score total:  62 / 80 = 77.5 %  Functional limitation: Minimal    COGNITION: Overall cognitive status: Within functional limits for tasks assessed    SENSATION: WFL  EDEMA:  Circumferential:   R: mid patella - 39.0 cm L: mid patella - 40.5 cm  POSTURE:  L knee slightly flexed in stance  PALPATION: Limited patellar mobility L>R  Increased muscle tension in L lateral quad with TPs mid VL  MUSCLE LENGTH: Hamstrings: mod tight L, mild tight R ITB: mod tight L, mild tight R Piriformis: mod tight B Hip flexors: mod/severe tight L, mod tight R  Quads: mod/severe tight L, mod tight R Heelcord: WFL  LOWER EXTREMITY ROM:  A/PROM Right eval  Left eval  Knee flexion 139 106  Knee extension 0 / -2 hyperextension when supported 8 LAQ / 3 supported  (Blank rows = not tested)  LOWER EXTREMITY MMT:  MMT Right eval Left eval  Hip flexion 4+ 4  Hip extension 4+ 4+  Hip abduction 5 4+  Hip adduction 4+ 4  Hip internal rotation 5 4+  Hip external rotation 4+ 4  Knee flexion 5 4+  Knee extension 5 4+  Ankle dorsiflexion 4+ 4+  Ankle plantarflexion    Ankle inversion    Ankle eversion     (Blank rows = not tested)  FUNCTIONAL TESTS: (12/10/23) 5 times sit to stand: 11.56 sec  Timed up and go (TUG): 7.31 sec  10 meter walk test: 6.34 sec  Gait speed: 5.17 ft/sec  GAIT: Distance walked: Clinic distances Assistive device utilized: None Level of assistance: Complete Independence Gait pattern: decreased stance time- Left, knee flexed in stance- Left, and antalgic   TODAY'S TREATMENT:   12/20/2023  THERAPEUTIC EXERCISE: To improve strength, endurance, ROM, and flexibility.  Demonstration, verbal and tactile cues throughout for technique.  NuStep - L6 x 5' (UE/LE) - time limited by sensation of impending cramp in R posterior leg Calf stretches: Gastroc - runners position x 30 on R Gastroc - toes elevated on 2 block 2 x 30 B - pt noting best stretch Soleus - toes elevated on 2 block 2 x 30 B Gastroc - negative heel off edge of 4 step x 30 B  THERAPEUTIC ACTIVITIES: To improve functional performance.  Demonstration, verbal and tactile cues throughout for technique. L 6 fwd step-up 2 x 10, 2nd set with GTB TKE Standing L GTB TKE 3 x 10 L 6 lateral step-up 2 x 10 L 6 lateral eccentric step-down with light heel tap to floor 2 x 10 L 6 fwd eccentric step-down with light toe tap to floor 2 x 10  NEUROMUSCULAR RE-EDUCATION: To improve balance, coordination, kinesthesia, posture, proprioception, and reduce fall risk. L SLS + slight knee flexion & 5-way tap to colored dots x 10   12/12/2023  THERAPEUTIC EXERCISE: To  improve strength, endurance, ROM, and flexibility.  Demonstration, verbal and tactile cues throughout for technique.  Rec Bike - L2 x 4' (seat #5) - stopped early due to cramping in R HS Nustep L6x22min- no cramping  Standing L ITB stretch at counter x 30'  MANUAL THERAPY: To promote normalized muscle tension, improve joint mobility and/or for pain modulation  Patellar mobs medial and superior glides STM to R adductors, lateral quads  NEUROMUSCULAR RE-EDUCATION: To improve coordination, kinesthesia, posture, and proprioception Standing 4 way hip RTB anchored x 10 BLE Wall squats 4x10' just above 90 deg   12/10/2023  THERAPEUTIC EXERCISE: To improve strength, endurance, ROM, and flexibility.  Demonstration, verbal and tactile cues throughout for technique.  Rec Bike - L3 x 4' (seat #5) - stopped early due to cramping in R HS Seated HS stretch with strap + DF lift for calf stretch 3 x 30 bil Standing quad stretch with strap 3 x 30 L 1/2 kneel lunge hip flexor stretch 2 x 30 Standing hip flexor stretch with lower leg supported on chair x 30 Seated lunge position hip flexor stretch x 30 Seated L knee flexion/extension heel slides with foot on sliding disc x 10 Seated L LAQ 3# 2 x 10 Hooklying L quad set +  SLR 3# 2 x 10 L quad set with towel roll under heel 10 x 5 Supine L heel slide with strap to increase L knee flexion x 10 - patient noting difficulty initiating straightening of knee after full flexion   11/27/2023 - Eval SELF CARE:  Reviewed eval findings and role of PT in addressing identified deficits as well as instruction in initial HEP (see below).    PATIENT EDUCATION:  Education details: HEP update  Person educated: Patient Education method: Explanation, Demonstration, Verbal cues, and MedBridge[employer] app updated Education comprehension: verbalized understanding, returned demonstration, verbal cues required, and needs further education  HOME EXERCISE PROGRAM: *Pt using  MedBridgeGO app.  Access Code: [email or web address] URL: [email or web address] Date: 12/20/2023 Prepared by: Elijah Hidden  Exercises - Gastroc Stretch on Book  - 2 x daily - 7 x weekly - 3 reps - 30 sec hold - Seated Hamstring Stretch with Strap  - 2 x daily - 7 x weekly - 3 reps - 30 sec hold - Standing Quad Stretch with Strap  - 2 x daily - 7 x weekly - 3 reps - 30 sec hold - Standing Hip Flexor Stretch on Chair  - 2 x daily - 7 x weekly - 3 reps - 30 sec hold - Quad Setting and Stretching  - 1 x daily - 7 x weekly - 2 sets - 10 reps - 3-5 sec hold - Active Straight Leg Raise with Quad Set  - 1 x daily - 7 x weekly - 2 sets - 10 reps - 3-5 sec hold - Seated Long Arc Quad with Ankle Weight  - 1 x daily - 7 x weekly - 2 sets - 10 reps - 3 sec hold - Supine Heel Slide with Strap  - 1 x daily - 7 x weekly - 2 sets - 10 reps - 3 sec hold - Standing Hip Abduction with Anchored Resistance  - 1 x daily - 7 x weekly - 3 sets - 10 reps - Standing Hip Flexion with Anchored Resistance  - 1 x daily - 7 x weekly - 3 sets - 10 reps - Standing Hip Extension and Flexion with Resistance  - 1 x daily - 7 x weekly - 3 sets - 10 reps - Standing Hip Adduction with Anchored Resistance  - 1 x daily - 7 x weekly - 3 sets - 10 reps - Standing Terminal Knee Extension with Resistance  - 1 x daily - 7 x weekly - 2 sets - 10 reps - 5 sec hold   ASSESSMENT:  CLINICAL IMPRESSION: Heavyn continues to be limited with the warm-up on both the recumbent bike and NuStep with feeling of impending cramping in the R posterior knee and upper calf  > lower thigh.  Stretching added to target gastroc muscles with pt reporting this is where she feels the problem.  She also notes she feels limited in her quads with stair negotiation, therefore worked on step-up/downs as well as GTB TKE and 5-way clock reaches to promote improved quad control.  Naija will benefit from continued skilled PT to address ongoing flexibility/ROM and  strength deficits to improve mobility and activity tolerance with decreased pain interference in preparation for planned TKA on 02/14/2024.   EVAL: EMSLEY CUSTER is a 68 y.o. female who was referred to physical therapy for evaluation and treatment for chronic L knee pain secondary to advanced L knee osteoarthritis.  She is planning to undergo L TKA in early January 2026 and requested PT  to help her prepare for surgery.   Patient reports chronic L knee pain, worse with walking and climbing stairs.  Patient has deficits in L knee ROM, L>R LE flexibility, L>R strength, abnormal posture with L knee slightly flexed in stance, and TTP with abnormal muscle tension and lateral quads and hamstrings which are interfering with ADLs and are impacting quality of life.  On LEFS patient scored 62/80 demonstrating minimal functional limitation.  Judah will benefit from skilled PT to address above deficits to improve mobility and activity tolerance with decreased pain interference.  OBJECTIVE IMPAIRMENTS: Abnormal gait, decreased activity tolerance, decreased balance, decreased knowledge of use of DME, decreased mobility, difficulty walking, decreased ROM, decreased strength, increased edema, increased fascial restrictions, impaired perceived functional ability, increased muscle spasms, impaired flexibility, postural dysfunction, and pain.   ACTIVITY LIMITATIONS: carrying, lifting, sitting, standing, squatting, sleeping, stairs, transfers, and locomotion level  PARTICIPATION LIMITATIONS: cleaning and community activity  PERSONAL FACTORS: Past/current experiences, Time since onset of injury/illness/exacerbation, and 1-2 comorbidities: osteoarthritis, bunion surgery are also affecting patient's functional outcome.   REHAB POTENTIAL: Excellent  CLINICAL DECISION MAKING: Evolving/moderate complexity  EVALUATION COMPLEXITY: Moderate   GOALS: Goals reviewed with patient? Yes  SHORT TERM GOALS: Target date:  12/25/2023  Patient will be independent with initial HEP. Baseline: Initial HEP provided eval 12/10/23 - HEP reviewed and updated Goal status: IN PROGRESS - 12/20/23 - HEP reviewed and updated  2.  Patient will report at least 10-25% improvement in L proximal LE pain to improve QOL. Baseline: 3/10 Goal status: INITIAL  3.  Patient will improve 5x STS time to </= 15 seconds to demonstrate improved functional strength and transfer efficiency. Baseline: TBA Goal status: MET - 12/10/23 - 11.56 sec   LONG TERM GOALS: Target date: 01/22/2024   Patient will be independent with advanced/ongoing HEP to improve outcomes and carryover.  Baseline:  Goal status: INITIAL  2.  Patient will report at least 25-50% improvement in L knee/proximal LE pain to improve QOL. Baseline: 3/10 Goal status: INITIAL  3.  Patient will demonstrate improved L knee AROM to >/= 3-120 deg to allow for normal gait and stair mechanics. Baseline: Refer to above LE ROM table Goal status: INITIAL  4.  Patient will demonstrate improved B LE strength to >/= 4+/5 for improved stability and ease of mobility. Baseline: Refer to above LE MMT table Goal status: INITIAL  5.  Patient will be able to demonstrate proper gait pattern with RW to prepare for post-op mobility following TKA.  Baseline: currently not using AD Goal status: INITIAL  6. Patient will be able to ascend/descend stairs with 1 HR and step-to pattern safely to access home and community after TKA.  Baseline:  Goal status: INITIAL  7.  Patient will report >/= 67/80 on LEFS to demonstrate improved functional ability. Baseline: 62 / 80 = 77.5 % Goal status: INITIAL  8.  Patient will demonstrate at least 19/24 on DGI to decrease risk of falls. Baseline:  Goal status: INITIAL   9.  Patient will verbalize understanding of expected post-op rehab program including initial post-op HEP. Baseline:  Goal status: INITIAL   PLAN:  PT FREQUENCY:  1-2x/week  PT DURATION: 6 weeks  PLANNED INTERVENTIONS: 97164- PT Re-evaluation, 97750- Physical Performance Testing, 97110-Therapeutic exercises, 97530- Therapeutic activity, [id number]- Neuromuscular re-education, 97535- Self Care, [id number]- Manual therapy, [phone]- Gait training, [id number]- Aquatic Therapy, [phone]- Electrical stimulation (unattended), 97016- Vasopneumatic device, [id number]- Ultrasound, [id number]- Ionotophoresis 4mg /ml Dexamethasone , Patient/Family education, Balance training, Stair training, Taping, Joint  mobilization, DME instructions, Cryotherapy, and Moist heat  PLAN FOR NEXT SESSION: STG assessment; ROM reassessment; Progress L knee ROM exercises as well as LE strengthening to help prepare for upcoming TKR surgery - reviewing and updating HEP as indicated; provide education in proper use of RW as AD for postop mobility including safe transfer techniques, proper gait pattern and stair negotiation    Elijah CHRISTELLA Hidden, PT 12/20/2023, 12:13 PM

## 2023-12-25 ENCOUNTER — Ambulatory Visit

## 2023-12-25 DIAGNOSIS — M25662 Stiffness of left knee, not elsewhere classified: Secondary | ICD-10-CM

## 2023-12-25 DIAGNOSIS — R262 Difficulty in walking, not elsewhere classified: Secondary | ICD-10-CM

## 2023-12-25 DIAGNOSIS — M25562 Pain in left knee: Secondary | ICD-10-CM | POA: Diagnosis not present

## 2023-12-25 DIAGNOSIS — G8929 Other chronic pain: Secondary | ICD-10-CM

## 2023-12-25 DIAGNOSIS — R600 Localized edema: Secondary | ICD-10-CM

## 2023-12-25 DIAGNOSIS — M62838 Other muscle spasm: Secondary | ICD-10-CM

## 2023-12-25 DIAGNOSIS — M6281 Muscle weakness (generalized): Secondary | ICD-10-CM

## 2023-12-25 NOTE — Therapy (Signed)
 OUTPATIENT PHYSICAL THERAPY LOWER EXTREMITY TREATMENT   Patient Name: Kathleen Meyers MRN: [id number] DOB:04-Aug-1955, 68 y.o., female Today's Date: 12/25/2023   END OF SESSION:  PT End of Session - 12/25/23 1538     Visit Number 5    Date for Recertification  01/22/24    Authorization Type Medicare & [employer]    Progress Note Due on Visit 10    PT Start Time 1531    PT Stop Time 1616    PT Time Calculation (min) 45 min    Activity Tolerance Patient tolerated treatment well    Behavior During Therapy [facility] for tasks assessed/performed              History reviewed. No pertinent past medical history. Past Surgical History:  Procedure Laterality Date   BREAST CYST ASPIRATION Left    DILATION AND CURETTAGE OF UTERUS     ENT surgery as a child      HERNIA REPAIR     umbilical as a child    infertility surgery      LAPAROSCOPIC ASSISTED VAGINAL HYSTERECTOMY N/A 08/05/2014   Procedure: LAPAROSCOPIC ASSISTED VAGINAL HYSTERECTOMY;  Surgeon: Truman Corona, MD;  Location: [facility];  Service: Gynecology;  Laterality: N/A;   SALPINGOOPHORECTOMY Bilateral 08/05/2014   Procedure: BILATERAL SALPINGO OOPHORECTOMY;  Surgeon: Truman Corona, MD;  Location: [facility];  Service: Gynecology;  Laterality: Bilateral;   Patient Active Problem List   Diagnosis Date Noted   Unilateral primary osteoarthritis, left knee 04/03/2023   Fibroids 08/05/2014   Bunion 11/12/2006   FOOT PAIN, RIGHT 11/12/2006   MUSCLE STRAIN, LEFT BUTTOCK 07/16/2006    PCP: Chrystal Lamarr RAMAN, MD   REFERRING PROVIDER: Gretta Bertrum ORN, PA-C   REFERRING DIAG: [phone] (ICD-10-CM) - Chronic pain of left knee   THERAPY DIAG:  Chronic pain of left knee  Stiffness of left knee, not elsewhere classified  Muscle weakness (generalized)  Difficulty in walking, not elsewhere classified  Localized edema  Other muscle spasm  RATIONALE FOR EVALUATION AND TREATMENT: Rehabilitation  ONSET DATE: Chronic   NEXT  MD VISIT: 12/11/2023 with Lonni Poli, MD   SUBJECTIVE:                                                                                                                                                                                                         SUBJECTIVE STATEMENT: L knee cracking more today. TKA surgery scheduled for 02/14/2024.  EVAL: Pt reports advanced OA in her L knee for which she is planning to have TKA in early January  2026.  She requested PT now to help with her walking, esp up stairs, and make sure she is doing the right exercises to prepare for surgery.  She feels like she is now starting to have more nervy pain in her thigh. Also has been noting more of a feeling of the knee wanting to give way while walking which will stop her in tracks.  PAIN: Are you having pain? Yes: NPRS scale: 3/10  Pain location: L knee  Pain description: achy, tense when walking  Aggravating factors: walking, stairs Relieving factors: Aleve or Tylenol , elevation   Are you having pain? Yes: NPRS scale: 4/10 Pain location: R posterior knee, lower thigh and upper calf Pain description: muscle spasm/cramp Aggravating factors: recumbent bike and NuStep Relieving factors: ceasing activity   PERTINENT HISTORY:  osteoarthritis - scheduled for L TKA in early January 2026, bunion surgery  PRECAUTIONS: None  RED FLAGS: None  WEIGHT BEARING RESTRICTIONS: No  FALLS:  Has patient fallen in last 6 months? Yes. Number of falls 1 in past 6 months, 2 in past year - both tripping over something  LIVING ENVIRONMENT: Lives with: lives with their spouse Lives in: House Stairs: Yes: Internal: 14 steps; on right going up and External: 4 steps; bilateral but cannot reach both Has following equipment at home: Crutches  OCCUPATION: FT - Marketing - mostly deskwork  PLOF: Independent and Leisure: walking, riding bike, exercise 3-4x/wk   PATIENT GOALS: To get stronger before TKA  surgery.   OBJECTIVE: (objective measures completed at initial evaluation unless otherwise dated)  DIAGNOSTIC FINDINGS:  11/11/23 - XR L knee: Left knee: Tricompartmental arthritis.  No acute fractures.  Near  bone-on-bone medial compartment.  Severe patellofemoral changes with large  osteophytes off the superior and inferior inferior poles.  Knee is well  located.  No acute fractures.   PATIENT SURVEYS:  LEFS  Extreme difficulty/unable (0), Quite a bit of difficulty (1), Moderate difficulty (2), Little difficulty (3), No difficulty (4) Survey date:  11/27/23  Any of your usual work, housework or school activities 4  2. Usual hobbies, recreational or sporting activities 4  3. Getting into/out of the bath 4  4. Walking between rooms 4  5. Putting on socks/shoes 3  6. Squatting  3  7. Lifting an object, like a bag of groceries from the floor 3  8. Performing light activities around your home 4  9. Performing heavy activities around your home 3  10. Getting into/out of a car 4  11. Walking 2 blocks 3  12. Walking 1 mile 3  13. Going up/down 10 stairs (1 flight) 3  14. Standing for 1 hour 3  15.  sitting for 1 hour 3  16. Running on even ground 2  17. Running on uneven ground 2  18. Making sharp turns while running fast 0  19. Hopping  3  20. Rolling over in bed 4  Score total:  62 / 80 = 77.5 %  Functional limitation: Minimal    COGNITION: Overall cognitive status: Within functional limits for tasks assessed    SENSATION: WFL  EDEMA:  Circumferential:   R: mid patella - 39.0 cm L: mid patella - 40.5 cm  POSTURE:  L knee slightly flexed in stance  PALPATION: Limited patellar mobility L>R  Increased muscle tension in L lateral quad with TPs mid VL  MUSCLE LENGTH: Hamstrings: mod tight L, mild tight R ITB: mod tight L, mild tight R Piriformis: mod tight B Hip flexors:  mod/severe tight L, mod tight R Quads: mod/severe tight L, mod tight R Heelcord: WFL  LOWER  EXTREMITY ROM:  A/PROM Right eval Left eval 12/25/23  Knee flexion 139 106 123  Knee extension 0 / -2 hyperextension when supported 8 LAQ / 3 supported 6 LAQ/ supported 3  (Blank rows = not tested)  LOWER EXTREMITY MMT:  MMT Right eval Left eval  Hip flexion 4+ 4  Hip extension 4+ 4+  Hip abduction 5 4+  Hip adduction 4+ 4  Hip internal rotation 5 4+  Hip external rotation 4+ 4  Knee flexion 5 4+  Knee extension 5 4+  Ankle dorsiflexion 4+ 4+  Ankle plantarflexion    Ankle inversion    Ankle eversion     (Blank rows = not tested)  FUNCTIONAL TESTS: (12/10/23) 5 times sit to stand: 11.56 sec  Timed up and go (TUG): 7.31 sec  10 meter walk test: 6.34 sec  Gait speed: 5.17 ft/sec  GAIT: Distance walked: Clinic distances Assistive device utilized: None Level of assistance: Complete Independence Gait pattern: decreased stance time- Left, knee flexed in stance- Left, and antalgic   TODAY'S TREATMENT:  12/25/23 THERAPEUTIC EXERCISE: To improve strength, endurance, ROM, and flexibility.  Demonstration, verbal and tactile cues throughout for technique.  Bike L2x14min Heel drop calf stretch from step 2x30' Seated HS stretch hip hinge 2x30' BLE HEP review and clarification on which exercises to perform daily, every other day, etc THERAPEUTIC ACTIVITIES: To improve functional performance.  Demonstration, verbal and tactile cues throughout for technique. L black TB TKE x 20 L step ups 6 x 20 L lateral step ups 6 x 20 L step downs with toe tap to floor 6' x 20 NEUROMUSCULAR RE-EDUCATION: To improve balance, coordination, kinesthesia, posture, proprioception, and reduce fall risk. R/L SLS + slight knee flexion & 5-way tap to colored dots x 10 standing on foam  12/20/2023  THERAPEUTIC EXERCISE: To improve strength, endurance, ROM, and flexibility.  Demonstration, verbal and tactile cues throughout for technique.  NuStep - L6 x 5' (UE/LE) - time limited by sensation of impending  cramp in R posterior leg Calf stretches: Gastroc - runners position x 30 on R Gastroc - toes elevated on 2 block 2 x 30 B - pt noting best stretch Soleus - toes elevated on 2 block 2 x 30 B Gastroc - negative heel off edge of 4 step x 30 B  THERAPEUTIC ACTIVITIES: To improve functional performance.  Demonstration, verbal and tactile cues throughout for technique. L 6 fwd step-up 2 x 10, 2nd set with GTB TKE Standing L GTB TKE 3 x 10 L 6 lateral step-up 2 x 10 L 6 lateral eccentric step-down with light heel tap to floor 2 x 10 L 6 fwd eccentric step-down with light toe tap to floor 2 x 10  NEUROMUSCULAR RE-EDUCATION: To improve balance, coordination, kinesthesia, posture, proprioception, and reduce fall risk. L SLS + slight knee flexion & 5-way tap to colored dots x 10   12/12/2023  THERAPEUTIC EXERCISE: To improve strength, endurance, ROM, and flexibility.  Demonstration, verbal and tactile cues throughout for technique.  Rec Bike - L2 x 4' (seat #5) - stopped early due to cramping in R HS Nustep L6x60min- no cramping  Standing L ITB stretch at counter x 30'  MANUAL THERAPY: To promote normalized muscle tension, improve joint mobility and/or for pain modulation  Patellar mobs medial and superior glides STM to R adductors, lateral quads  NEUROMUSCULAR RE-EDUCATION: To improve coordination,  kinesthesia, posture, and proprioception Standing 4 way hip RTB anchored x 10 BLE Wall squats 4x10' just above 90 deg   12/10/2023  THERAPEUTIC EXERCISE: To improve strength, endurance, ROM, and flexibility.  Demonstration, verbal and tactile cues throughout for technique.  Rec Bike - L3 x 4' (seat #5) - stopped early due to cramping in R HS Seated HS stretch with strap + DF lift for calf stretch 3 x 30 bil Standing quad stretch with strap 3 x 30 L 1/2 kneel lunge hip flexor stretch 2 x 30 Standing hip flexor stretch with lower leg supported on chair x 30 Seated lunge position hip  flexor stretch x 30 Seated L knee flexion/extension heel slides with foot on sliding disc x 10 Seated L LAQ 3# 2 x 10 Hooklying L quad set + SLR 3# 2 x 10 L quad set with towel roll under heel 10 x 5 Supine L heel slide with strap to increase L knee flexion x 10 - patient noting difficulty initiating straightening of knee after full flexion   11/27/2023 - Eval SELF CARE:  Reviewed eval findings and role of PT in addressing identified deficits as well as instruction in initial HEP (see below).    PATIENT EDUCATION:  Education details: HEP update  Person educated: Patient Education method: Explanation, Demonstration, Verbal cues, and MedBridgeGO app updated Education comprehension: verbalized understanding, returned demonstration, verbal cues required, and needs further education  HOME EXERCISE PROGRAM: *Pt using MedBridgeGO app.  Access Code: [email or web address] URL: [email or web address] Date: 12/20/2023 Prepared by: Elijah Hidden  Exercises - Gastroc Stretch on Book  - 2 x daily - 7 x weekly - 3 reps - 30 sec hold - Seated Hamstring Stretch with Strap  - 2 x daily - 7 x weekly - 3 reps - 30 sec hold - Standing Quad Stretch with Strap  - 2 x daily - 7 x weekly - 3 reps - 30 sec hold - Standing Hip Flexor Stretch on Chair  - 2 x daily - 7 x weekly - 3 reps - 30 sec hold - Quad Setting and Stretching  - 1 x daily - 7 x weekly - 2 sets - 10 reps - 3-5 sec hold - Active Straight Leg Raise with Quad Set  - 1 x daily - 7 x weekly - 2 sets - 10 reps - 3-5 sec hold - Seated Long Arc Quad with Ankle Weight  - 1 x daily - 7 x weekly - 2 sets - 10 reps - 3 sec hold - Supine Heel Slide with Strap  - 1 x daily - 7 x weekly - 2 sets - 10 reps - 3 sec hold - Standing Hip Abduction with Anchored Resistance  - 1 x daily - 7 x weekly - 3 sets - 10 reps - Standing Hip Flexion with Anchored Resistance  - 1 x daily - 7 x weekly - 3 sets - 10 reps - Standing Hip Extension and Flexion with  Resistance  - 1 x daily - 7 x weekly - 3 sets - 10 reps - Standing Hip Adduction with Anchored Resistance  - 1 x daily - 7 x weekly - 3 sets - 10 reps - Standing Terminal Knee Extension with Resistance  - 1 x daily - 7 x weekly - 2 sets - 10 reps - 5 sec hold   ASSESSMENT:  CLINICAL IMPRESSION: Reviewed HEP with patient and gave instruction for clarification on when to perform exercises and when to rest. Continued working on  LE flexibility and functional strength. Pt shows difficulty with fwd eccentric step downs. Noted crepitus with step ups as well but no pain.  Latrica will benefit from continued skilled PT to address ongoing flexibility/ROM and strength deficits to improve mobility and activity tolerance with decreased pain interference in preparation for planned TKA on 02/14/2024.   EVAL: DEONI COSEY is a 68 y.o. female who was referred to physical therapy for evaluation and treatment for chronic L knee pain secondary to advanced L knee osteoarthritis.  She is planning to undergo L TKA in early January 2026 and requested PT to help her prepare for surgery.   Patient reports chronic L knee pain, worse with walking and climbing stairs.  Patient has deficits in L knee ROM, L>R LE flexibility, L>R strength, abnormal posture with L knee slightly flexed in stance, and TTP with abnormal muscle tension and lateral quads and hamstrings which are interfering with ADLs and are impacting quality of life.  On LEFS patient scored 62/80 demonstrating minimal functional limitation.  Jasey will benefit from skilled PT to address above deficits to improve mobility and activity tolerance with decreased pain interference.  OBJECTIVE IMPAIRMENTS: Abnormal gait, decreased activity tolerance, decreased balance, decreased knowledge of use of DME, decreased mobility, difficulty walking, decreased ROM, decreased strength, increased edema, increased fascial restrictions, impaired perceived functional ability, increased muscle  spasms, impaired flexibility, postural dysfunction, and pain.   ACTIVITY LIMITATIONS: carrying, lifting, sitting, standing, squatting, sleeping, stairs, transfers, and locomotion level  PARTICIPATION LIMITATIONS: cleaning and community activity  PERSONAL FACTORS: Past/current experiences, Time since onset of injury/illness/exacerbation, and 1-2 comorbidities: osteoarthritis, bunion surgery are also affecting patient's functional outcome.   REHAB POTENTIAL: Excellent  CLINICAL DECISION MAKING: Evolving/moderate complexity  EVALUATION COMPLEXITY: Moderate   GOALS: Goals reviewed with patient? Yes  SHORT TERM GOALS: Target date: 12/25/2023  Patient will be independent with initial HEP. Baseline: Initial HEP provided eval 12/10/23 - HEP reviewed and updated Goal status: IN PROGRESS - 12/20/23 - HEP reviewed and updated  2.  Patient will report at least 10-25% improvement in L proximal LE pain to improve QOL. Baseline: 3/10 Goal status: IN PROGRESS- 12/25/23 no improvement  3.  Patient will improve 5x STS time to </= 15 seconds to demonstrate improved functional strength and transfer efficiency. Baseline: TBA Goal status: MET - 12/10/23 - 11.56 sec   LONG TERM GOALS: Target date: 01/22/2024   Patient will be independent with advanced/ongoing HEP to improve outcomes and carryover.  Baseline:  Goal status: INITIAL  2.  Patient will report at least 25-50% improvement in L knee/proximal LE pain to improve QOL. Baseline: 3/10 Goal status: INITIAL  3.  Patient will demonstrate improved L knee AROM to >/= 3-120 deg to allow for normal gait and stair mechanics. Baseline: Refer to above LE ROM table Goal status: INITIAL  4.  Patient will demonstrate improved B LE strength to >/= 4+/5 for improved stability and ease of mobility. Baseline: Refer to above LE MMT table Goal status: INITIAL  5.  Patient will be able to demonstrate proper gait pattern with RW to prepare for post-op  mobility following TKA.  Baseline: currently not using AD Goal status: INITIAL  6. Patient will be able to ascend/descend stairs with 1 HR and step-to pattern safely to access home and community after TKA.  Baseline:  Goal status: INITIAL  7.  Patient will report >/= 67/80 on LEFS to demonstrate improved functional ability. Baseline: 62 / 80 = 77.5 % Goal status: INITIAL  8.  Patient will demonstrate at least 19/24 on DGI to decrease risk of falls. Baseline:  Goal status: INITIAL   9.  Patient will verbalize understanding of expected post-op rehab program including initial post-op HEP. Baseline:  Goal status: INITIAL   PLAN:  PT FREQUENCY: 1-2x/week  PT DURATION: 6 weeks  PLANNED INTERVENTIONS: 02835- PT Re-evaluation, 97750- Physical Performance Testing, 97110-Therapeutic exercises, 97530- Therapeutic activity, [id number]- Neuromuscular re-education, 97535- Self Care, 02859- Manual therapy, [phone]- Gait training, [phone]- Aquatic Therapy, [phone]- Electrical stimulation (unattended), [id number]- Vasopneumatic device, 97035- Ultrasound, 02966- Ionotophoresis 4mg /ml Dexamethasone , Patient/Family education, Balance training, Stair training, Taping, Joint mobilization, DME instructions, Cryotherapy, and Moist heat  PLAN FOR NEXT SESSION: STG assessment; ROM reassessment; Progress L knee ROM exercises as well as LE strengthening to help prepare for upcoming TKR surgery - reviewing and updating HEP as indicated; provide education in proper use of RW as AD for postop mobility including safe transfer techniques, proper gait pattern and stair negotiation    Sol LITTIE Gaskins, PTA 12/25/2023, 4:18 PM

## 2023-12-26 ENCOUNTER — Encounter: Admitting: Orthopaedic Surgery

## 2023-12-27 ENCOUNTER — Ambulatory Visit: Admitting: Physical Therapy

## 2023-12-27 ENCOUNTER — Encounter: Payer: Self-pay | Admitting: Physical Therapy

## 2023-12-27 DIAGNOSIS — M6281 Muscle weakness (generalized): Secondary | ICD-10-CM

## 2023-12-27 DIAGNOSIS — M62838 Other muscle spasm: Secondary | ICD-10-CM

## 2023-12-27 DIAGNOSIS — R262 Difficulty in walking, not elsewhere classified: Secondary | ICD-10-CM

## 2023-12-27 DIAGNOSIS — M25662 Stiffness of left knee, not elsewhere classified: Secondary | ICD-10-CM

## 2023-12-27 DIAGNOSIS — M25562 Pain in left knee: Secondary | ICD-10-CM | POA: Diagnosis not present

## 2023-12-27 DIAGNOSIS — R600 Localized edema: Secondary | ICD-10-CM

## 2023-12-27 DIAGNOSIS — G8929 Other chronic pain: Secondary | ICD-10-CM

## 2023-12-27 NOTE — Therapy (Signed)
 OUTPATIENT PHYSICAL THERAPY LOWER EXTREMITY TREATMENT   Patient Name: Kathleen Meyers MRN: [id number] DOB:1955/06/18, 68 y.o., female Today's Date: 12/27/2023   END OF SESSION:  PT End of Session - 12/27/23 0848     Visit Number 6    Date for Recertification  01/22/24    Authorization Type Medicare & [employer]    Progress Note Due on Visit 10    PT Start Time 0848    PT Stop Time 0934    PT Time Calculation (min) 46 min    Activity Tolerance Patient tolerated treatment well    Behavior During Therapy [facility] for tasks assessed/performed               No past medical history on file. Past Surgical History:  Procedure Laterality Date   BREAST CYST ASPIRATION Left    DILATION AND CURETTAGE OF UTERUS     ENT surgery as a child      HERNIA REPAIR     umbilical as a child    infertility surgery      LAPAROSCOPIC ASSISTED VAGINAL HYSTERECTOMY N/A 08/05/2014   Procedure: LAPAROSCOPIC ASSISTED VAGINAL HYSTERECTOMY;  Surgeon: Truman Corona, MD;  Location: [facility];  Service: Gynecology;  Laterality: N/A;   SALPINGOOPHORECTOMY Bilateral 08/05/2014   Procedure: BILATERAL SALPINGO OOPHORECTOMY;  Surgeon: Truman Corona, MD;  Location: [facility];  Service: Gynecology;  Laterality: Bilateral;   Patient Active Problem List   Diagnosis Date Noted   Unilateral primary osteoarthritis, left knee 04/03/2023   Fibroids 08/05/2014   Bunion 11/12/2006   FOOT PAIN, RIGHT 11/12/2006   MUSCLE STRAIN, LEFT BUTTOCK 07/16/2006    PCP: Chrystal Lamarr RAMAN, MD   REFERRING PROVIDER: Gretta Bertrum ORN, PA-C   REFERRING DIAG: [phone] (ICD-10-CM) - Chronic pain of left knee   THERAPY DIAG:  No diagnosis found.  RATIONALE FOR EVALUATION AND TREATMENT: Rehabilitation  ONSET DATE: Chronic   NEXT MD VISIT: 12/11/2023 with Lonni Poli, MD   SUBJECTIVE:                                                                                                                                                                                                          SUBJECTIVE STATEMENT: Pt reports her knee is a little more painful today - not sure if is due the inclement weather.  EVAL: Pt reports advanced OA in her L knee for which she is planning to have TKA in early January 2026.  She requested PT now to help with her walking, esp up stairs, and make sure she is  doing the right exercises to prepare for surgery.  She feels like she is now starting to have more nervy pain in her thigh. Also has been noting more of a feeling of the knee wanting to give way while walking which will stop her in tracks.  PAIN: Are you having pain? Yes: NPRS scale: 3-4/10  Pain location: L knee  Pain description: achy, tense when walking  Aggravating factors: walking, stairs Relieving factors: Aleve or Tylenol , elevation   Are you having pain? Yes: NPRS scale: 4/10 Pain location: R posterior knee, lower thigh and upper calf Pain description: muscle spasm/cramp Aggravating factors: recumbent bike and NuStep Relieving factors: ceasing activity   PERTINENT HISTORY:  osteoarthritis - scheduled for L TKA on 02/14/24, bunion surgery  PRECAUTIONS: None  RED FLAGS: None  WEIGHT BEARING RESTRICTIONS: No  FALLS:  Has patient fallen in last 6 months? Yes. Number of falls 1 in past 6 months, 2 in past year - both tripping over something  LIVING ENVIRONMENT: Lives with: lives with their spouse Lives in: House Stairs: Yes: Internal: 14 steps; on right going up and External: 4 steps; bilateral but cannot reach both Has following equipment at home: Crutches  OCCUPATION: FT - Marketing - mostly deskwork  PLOF: Independent and Leisure: walking, riding bike, exercise 3-4x/wk   PATIENT GOALS: To get stronger before TKA surgery.   OBJECTIVE: (objective measures completed at initial evaluation unless otherwise dated)  DIAGNOSTIC FINDINGS:  11/11/23 - XR L knee: Left knee: Tricompartmental arthritis.  No  acute fractures.  Near  bone-on-bone medial compartment.  Severe patellofemoral changes with large  osteophytes off the superior and inferior inferior poles.  Knee is well  located.  No acute fractures.   PATIENT SURVEYS:  LEFS  Extreme difficulty/unable (0), Quite a bit of difficulty (1), Moderate difficulty (2), Little difficulty (3), No difficulty (4) Survey date:  11/27/23  Any of your usual work, housework or school activities 4  2. Usual hobbies, recreational or sporting activities 4  3. Getting into/out of the bath 4  4. Walking between rooms 4  5. Putting on socks/shoes 3  6. Squatting  3  7. Lifting an object, like a bag of groceries from the floor 3  8. Performing light activities around your home 4  9. Performing heavy activities around your home 3  10. Getting into/out of a car 4  11. Walking 2 blocks 3  12. Walking 1 mile 3  13. Going up/down 10 stairs (1 flight) 3  14. Standing for 1 hour 3  15.  sitting for 1 hour 3  16. Running on even ground 2  17. Running on uneven ground 2  18. Making sharp turns while running fast 0  19. Hopping  3  20. Rolling over in bed 4  Score total:  62 / 80 = 77.5 %  Functional limitation: Minimal    COGNITION: Overall cognitive status: Within functional limits for tasks assessed    SENSATION: WFL  EDEMA:  Circumferential:   R: mid patella - 39.0 cm L: mid patella - 40.5 cm  POSTURE:  L knee slightly flexed in stance  PALPATION: Limited patellar mobility L>R  Increased muscle tension in L lateral quad with TPs mid VL  MUSCLE LENGTH: Hamstrings: mod tight L, mild tight R ITB: mod tight L, mild tight R Piriformis: mod tight B Hip flexors: mod/severe tight L, mod tight R Quads: mod/severe tight L, mod tight R Heelcord: WFL  LOWER EXTREMITY ROM:  A/PROM  Right eval Left eval 12/25/23  Knee flexion 139 106 123  Knee extension 0 / -2 hyperextension when supported 8 LAQ / 3 supported 6 LAQ/ supported 3  (Blank rows =  not tested)  LOWER EXTREMITY MMT:  MMT Right eval Left eval  Hip flexion 4+ 4  Hip extension 4+ 4+  Hip abduction 5 4+  Hip adduction 4+ 4  Hip internal rotation 5 4+  Hip external rotation 4+ 4  Knee flexion 5 4+  Knee extension 5 4+  Ankle dorsiflexion 4+ 4+  Ankle plantarflexion    Ankle inversion    Ankle eversion     (Blank rows = not tested)  FUNCTIONAL TESTS: (12/10/23) 5 times sit to stand: 11.56 sec  Timed up and go (TUG): 7.31 sec  10 meter walk test: 6.34 sec  Gait speed: 5.17 ft/sec  GAIT: Distance walked: Clinic distances Assistive device utilized: None Level of assistance: Complete Independence Gait pattern: decreased stance time- Left, knee flexed in stance- Left, and antalgic   TODAY'S TREATMENT:    12/27/2023  THERAPEUTIC EXERCISE: To improve strength, endurance, ROM, and flexibility.   Rec Bike - L2 x 6'  SELF CARE:  Provided education on post-surgical POC and safety with use of RW and SPC for assistive device(s) post-op. Provided instruction in height adjustment for RW and cane as well as proper sequencing and gait pattern with respective AD for immediate post-op period. Informed patient that needed DME typically provided prior to discharge from hospital, however she can inquire with MD for order if she wanted to work on obtaining DME ahead of time.  GAIT TRAINING: To improve safety with RW and SPC. 68' with RW/2WW demonstrating step-to pattern for immediate post-op period while nerve block still active progressing to step-through pattern as sensation returns, providing cues for posture and RW proximity with step placement between rear wheels of RW. Stair training up/down 1 flight with reciprocal pattern as tolerated in pre-op period as well as step-to pattern up with the good, down with the bad for immediate post-op mobility.  NEUROMUSCULAR RE-EDUCATION: To improve balance, coordination, kinesthesia, posture, proprioception, and reduce fall  risk. L SLS + slight knee flexion & 5-way tap to colored dots x 10  THERAPEUTIC ACTIVITIES: To improve functional performance.  Demonstration, verbal and tactile cues throughout for technique. L 6 fwd step-up + R high knee 2 x 10   12/25/23 THERAPEUTIC EXERCISE: To improve strength, endurance, ROM, and flexibility.  Demonstration, verbal and tactile cues throughout for technique.  Bike L2x3min Heel drop calf stretch from step 2x30' Seated HS stretch hip hinge 2x30' BLE HEP review and clarification on which exercises to perform daily, every other day, etc THERAPEUTIC ACTIVITIES: To improve functional performance.  Demonstration, verbal and tactile cues throughout for technique. L black TB TKE x 20 L step ups 6 x 20 L lateral step ups 6 x 20 L step downs with toe tap to floor 6' x 20 NEUROMUSCULAR RE-EDUCATION: To improve balance, coordination, kinesthesia, posture, proprioception, and reduce fall risk. R/L SLS + slight knee flexion & 5-way tap to colored dots x 10 standing on foam   12/20/2023  THERAPEUTIC EXERCISE: To improve strength, endurance, ROM, and flexibility.  Demonstration, verbal and tactile cues throughout for technique.  NuStep - L6 x 5' (UE/LE) - time limited by sensation of impending cramp in R posterior leg Calf stretches: Gastroc - runners position x 30 on R Gastroc - toes elevated on 2 block 2 x 30 B - pt  noting best stretch Soleus - toes elevated on 2 block 2 x 30 B Gastroc - negative heel off edge of 4 step x 30 B  THERAPEUTIC ACTIVITIES: To improve functional performance.  Demonstration, verbal and tactile cues throughout for technique. L 6 fwd step-up 2 x 10, 2nd set with GTB TKE Standing L GTB TKE 3 x 10 L 6 lateral step-up 2 x 10 L 6 lateral eccentric step-down with light heel tap to floor 2 x 10 L 6 fwd eccentric step-down with light toe tap to floor 2 x 10  NEUROMUSCULAR RE-EDUCATION: To improve balance, coordination, kinesthesia, posture,  proprioception, and reduce fall risk. L SLS + slight knee flexion & 5-way tap to colored dots x 10   12/12/2023  THERAPEUTIC EXERCISE: To improve strength, endurance, ROM, and flexibility.  Demonstration, verbal and tactile cues throughout for technique.  Rec Bike - L2 x 4' (seat #5) - stopped early due to cramping in R HS Nustep L6x54min- no cramping  Standing L ITB stretch at counter x 30'  MANUAL THERAPY: To promote normalized muscle tension, improve joint mobility and/or for pain modulation  Patellar mobs medial and superior glides STM to R adductors, lateral quads  NEUROMUSCULAR RE-EDUCATION: To improve coordination, kinesthesia, posture, and proprioception Standing 4 way hip RTB anchored x 10 BLE Wall squats 4x10' just above 90 deg   12/10/2023  THERAPEUTIC EXERCISE: To improve strength, endurance, ROM, and flexibility.  Demonstration, verbal and tactile cues throughout for technique.  Rec Bike - L3 x 4' (seat #5) - stopped early due to cramping in R HS Seated HS stretch with strap + DF lift for calf stretch 3 x 30 bil Standing quad stretch with strap 3 x 30 L 1/2 kneel lunge hip flexor stretch 2 x 30 Standing hip flexor stretch with lower leg supported on chair x 30 Seated lunge position hip flexor stretch x 30 Seated L knee flexion/extension heel slides with foot on sliding disc x 10 Seated L LAQ 3# 2 x 10 Hooklying L quad set + SLR 3# 2 x 10 L quad set with towel roll under heel 10 x 5 Supine L heel slide with strap to increase L knee flexion x 10 - patient noting difficulty initiating straightening of knee after full flexion   11/27/2023 - Eval SELF CARE:  Reviewed eval findings and role of PT in addressing identified deficits as well as instruction in initial HEP (see below).    PATIENT EDUCATION:  Education details: HEP update  Person educated: Patient Education method: Explanation, Demonstration, Verbal cues, and MedBridgeGO app updated Education  comprehension: verbalized understanding, returned demonstration, verbal cues required, and needs further education  HOME EXERCISE PROGRAM: *Pt using MedBridge[employer] app.  Access Code: [email or web address] URL: [email or web address] Date: 12/20/2023 Prepared by: Elijah Hidden  Exercises - Gastroc Stretch on Book  - 2 x daily - 7 x weekly - 3 reps - 30 sec hold - Seated Hamstring Stretch with Strap  - 2 x daily - 7 x weekly - 3 reps - 30 sec hold - Standing Quad Stretch with Strap  - 2 x daily - 7 x weekly - 3 reps - 30 sec hold - Standing Hip Flexor Stretch on Chair  - 2 x daily - 7 x weekly - 3 reps - 30 sec hold - Quad Setting and Stretching  - 1 x daily - 7 x weekly - 2 sets - 10 reps - 3-5 sec hold - Active Straight Leg Raise with Quad Set  -  1 x daily - 7 x weekly - 2 sets - 10 reps - 3-5 sec hold - Seated Long Arc Quad with Ankle Weight  - 1 x daily - 7 x weekly - 2 sets - 10 reps - 3 sec hold - Supine Heel Slide with Strap  - 1 x daily - 7 x weekly - 2 sets - 10 reps - 3 sec hold - Standing Hip Abduction with Anchored Resistance  - 1 x daily - 7 x weekly - 3 sets - 10 reps - Standing Hip Flexion with Anchored Resistance  - 1 x daily - 7 x weekly - 3 sets - 10 reps - Standing Hip Extension and Flexion with Resistance  - 1 x daily - 7 x weekly - 3 sets - 10 reps - Standing Hip Adduction with Anchored Resistance  - 1 x daily - 7 x weekly - 3 sets - 10 reps - Standing Terminal Knee Extension with Resistance  - 1 x daily - 7 x weekly - 2 sets - 10 reps - 5 sec hold   ASSESSMENT:  CLINICAL IMPRESSION: Tamicka was asking questions about DME needed postop following her upcoming TKA and whether equipment can be purchased in advance.  Patient informed that DME is typically issued while still in hospital postop but she could inquire with her physician/surgeon for an order to obtain the DME prior to surgery.  Training provided in proper height adjustment for assistive device as well as gait  sequencing with RW/2WW and SPC.  Gait training also provided for proper stair negotiation trying for reciprocal ascent/descent preoperatively as pain allows, along with education in step to pattern up with a good, down with the bad for stair navigation immediately postop.  She notes stairs continues to be one of the areas where she feels most limited especially leading with L LE on ascent therefore continue to target functional strengthening for quads and hip extensors to promote increased ease of stair negotiation.  Patient continues to note increased crepitus as she has been completing more exercises but reports pain remains manageable.  Donyelle will benefit from continued skilled PT to address ongoing flexibility/ROM and strength deficits to improve mobility and activity tolerance with decreased pain interference in preparation for planned TKA on 02/14/2024.   EVAL: KARIN PINEDO is a 68 y.o. female who was referred to physical therapy for evaluation and treatment for chronic L knee pain secondary to advanced L knee osteoarthritis.  She is planning to undergo L TKA in early January 2026 and requested PT to help her prepare for surgery.   Patient reports chronic L knee pain, worse with walking and climbing stairs.  Patient has deficits in L knee ROM, L>R LE flexibility, L>R strength, abnormal posture with L knee slightly flexed in stance, and TTP with abnormal muscle tension and lateral quads and hamstrings which are interfering with ADLs and are impacting quality of life.  On LEFS patient scored 62/80 demonstrating minimal functional limitation.  Tujuana will benefit from skilled PT to address above deficits to improve mobility and activity tolerance with decreased pain interference.  OBJECTIVE IMPAIRMENTS: Abnormal gait, decreased activity tolerance, decreased balance, decreased knowledge of use of DME, decreased mobility, difficulty walking, decreased ROM, decreased strength, increased edema, increased  fascial restrictions, impaired perceived functional ability, increased muscle spasms, impaired flexibility, postural dysfunction, and pain.   ACTIVITY LIMITATIONS: carrying, lifting, sitting, standing, squatting, sleeping, stairs, transfers, and locomotion level  PARTICIPATION LIMITATIONS: cleaning and community activity  PERSONAL FACTORS: Past/current  experiences, Time since onset of injury/illness/exacerbation, and 1-2 comorbidities: osteoarthritis, bunion surgery are also affecting patient's functional outcome.   REHAB POTENTIAL: Excellent  CLINICAL DECISION MAKING: Evolving/moderate complexity  EVALUATION COMPLEXITY: Moderate   GOALS: Goals reviewed with patient? Yes  SHORT TERM GOALS: Target date: 12/25/2023  Patient will be independent with initial HEP. Baseline: Initial HEP provided eval 12/10/23 - HEP reviewed and updated 12/20/23 - HEP reviewed and updated Goal status: MET - 12/27/23  2.  Patient will report at least 10-25% improvement in L proximal LE pain to improve QOL. Baseline: 3/10 Goal status: IN PROGRESS - 12/25/23 - no improvement  3.  Patient will improve 5x STS time to </= 15 seconds to demonstrate improved functional strength and transfer efficiency. Baseline: TBA Goal status: MET - 12/10/23 - 11.56 sec   LONG TERM GOALS: Target date: 01/22/2024   Patient will be independent with advanced/ongoing HEP to improve outcomes and carryover.  Baseline:  Goal status: IN PROGRESS - 12/27/23  2.  Patient will report at least 25-50% improvement in L knee/proximal LE pain to improve QOL. Baseline: 3/10 Goal status: INITIAL  3.  Patient will demonstrate improved L knee AROM to >/= 3-120 deg to allow for normal gait and stair mechanics. Baseline: Refer to above LE ROM table Goal status: MET - 12/25/23 - L knee AROM 3-123  4.  Patient will demonstrate improved B LE strength to >/= 4+/5 for improved stability and ease of mobility. Baseline: Refer to above LE MMT  table Goal status: INITIAL  5.  Patient will be able to demonstrate proper gait pattern with RW to prepare for post-op mobility following TKA.  Baseline: currently not using AD Goal status: IN PROGRESS - 12/27/23 - initial training provided  6. Patient will be able to ascend/descend stairs with 1 HR and step-to pattern safely to access home and community after TKA.  Baseline:  Goal status: IN PROGRESS - 12/27/23 - initial training provided  7.  Patient will report >/= 67/80 on LEFS to demonstrate improved functional ability. Baseline: 62 / 80 = 77.5 % Goal status: INITIAL  8.  Patient will demonstrate at least 19/24 on DGI to decrease risk of falls. Baseline:  Goal status: INITIAL   9.  Patient will verbalize understanding of expected post-op rehab program including initial post-op HEP. Baseline:  Goal status: INITIAL   PLAN:  PT FREQUENCY: 1-2x/week  PT DURATION: 6 weeks  PLANNED INTERVENTIONS: 02835- PT Re-evaluation, 97750- Physical Performance Testing, 97110-Therapeutic exercises, 97530- Therapeutic activity, [id number]- Neuromuscular re-education, 97535- Self Care, 02859- Manual therapy, [phone]- Gait training, [phone]- Aquatic Therapy, [phone]- Electrical stimulation (unattended), 97016- Vasopneumatic device, 97035- Ultrasound, 02966- Ionotophoresis 4mg /ml Dexamethasone , Patient/Family education, Balance training, Stair training, Taping, Joint mobilization, DME instructions, Cryotherapy, and Moist heat  PLAN FOR NEXT SESSION: Reassess STG #2 and LE MMT; Progress L knee ROM exercises as well as LE strengthening to help prepare for upcoming TKR surgery - reviewing and updating HEP as indicated; review education in proper use of RW as AD for postop mobility including safe transfer techniques, proper gait pattern and stair negotiation    Elijah CHRISTELLA Hidden, PT 12/27/2023, 1:05 PM

## 2023-12-31 ENCOUNTER — Ambulatory Visit: Admitting: Physical Therapy

## 2024-01-01 ENCOUNTER — Ambulatory Visit: Admitting: Orthopaedic Surgery

## 2024-01-08 ENCOUNTER — Ambulatory Visit

## 2024-01-10 ENCOUNTER — Ambulatory Visit: Admitting: Physical Therapy

## 2024-01-15 ENCOUNTER — Ambulatory Visit

## 2024-01-15 DIAGNOSIS — G8929 Other chronic pain: Secondary | ICD-10-CM | POA: Diagnosis present

## 2024-01-15 DIAGNOSIS — R262 Difficulty in walking, not elsewhere classified: Secondary | ICD-10-CM | POA: Diagnosis present

## 2024-01-15 DIAGNOSIS — M6281 Muscle weakness (generalized): Secondary | ICD-10-CM

## 2024-01-15 DIAGNOSIS — M25562 Pain in left knee: Secondary | ICD-10-CM | POA: Diagnosis present

## 2024-01-15 DIAGNOSIS — M62838 Other muscle spasm: Secondary | ICD-10-CM | POA: Diagnosis present

## 2024-01-15 DIAGNOSIS — R600 Localized edema: Secondary | ICD-10-CM

## 2024-01-15 DIAGNOSIS — M25662 Stiffness of left knee, not elsewhere classified: Secondary | ICD-10-CM

## 2024-01-15 NOTE — Therapy (Signed)
 OUTPATIENT PHYSICAL THERAPY LOWER EXTREMITY TREATMENT   Patient Name: CATHLYN TERSIGNI MRN: [id number] DOB:[birth year], 68 y.o., female Today's Date: 01/15/2024   END OF SESSION:  PT End of Session - 01/15/24 0904     Visit Number 7    Date for Recertification  01/22/24    Authorization Type Medicare & [employer]    Progress Note Due on Visit 10    PT Start Time 0840    PT Stop Time 0922    PT Time Calculation (min) 42 min    Activity Tolerance Patient tolerated treatment well    Behavior During Therapy [facility] for tasks assessed/performed                History reviewed. No pertinent past medical history. Past Surgical History:  Procedure Laterality Date   BREAST CYST ASPIRATION Left    DILATION AND CURETTAGE OF UTERUS     ENT surgery as a child      HERNIA REPAIR     umbilical as a child    infertility surgery      LAPAROSCOPIC ASSISTED VAGINAL HYSTERECTOMY N/A 08/05/2014   Procedure: LAPAROSCOPIC ASSISTED VAGINAL HYSTERECTOMY;  Surgeon: Truman Corona, MD;  Location: [facility];  Service: Gynecology;  Laterality: N/A;   SALPINGOOPHORECTOMY Bilateral 08/05/2014   Procedure: BILATERAL SALPINGO OOPHORECTOMY;  Surgeon: Truman Corona, MD;  Location: [facility];  Service: Gynecology;  Laterality: Bilateral;   Patient Active Problem List   Diagnosis Date Noted   Unilateral primary osteoarthritis, left knee 04/03/2023   Fibroids 08/05/2014   Bunion 11/12/2006   FOOT PAIN, RIGHT 11/12/2006   MUSCLE STRAIN, LEFT BUTTOCK 07/16/2006    PCP: Chrystal Lamarr RAMAN, MD   REFERRING PROVIDER: Gretta Bertrum ORN, PA-C   REFERRING DIAG: [phone] (ICD-10-CM) - Chronic pain of left knee   THERAPY DIAG:  Chronic pain of left knee  Stiffness of left knee, not elsewhere classified  Muscle weakness (generalized)  Difficulty in walking, not elsewhere classified  Localized edema  Other muscle spasm  RATIONALE FOR EVALUATION AND TREATMENT: Rehabilitation  ONSET DATE: Chronic    NEXT MD VISIT: 01/01/2024 with Lonni Poli, MD (cancelled)   SUBJECTIVE:                                                                                                                                                                                                         SUBJECTIVE STATEMENT: Pt reports her knee feels pretty good  EVAL: Pt reports advanced OA in her L knee for which she is planning to have TKA in early January  2026.  She requested PT now to help with her walking, esp up stairs, and make sure she is doing the right exercises to prepare for surgery.  She feels like she is now starting to have more nervy pain in her thigh. Also has been noting more of a feeling of the knee wanting to give way while walking which will stop her in tracks.  PAIN: Are you having pain? Yes: NPRS scale: 0/10  Pain location: L knee  Pain description: achy, tense when walking  Aggravating factors: walking, stairs Relieving factors: Aleve or Tylenol , elevation   Are you having pain? Yes: NPRS scale: 4/10 Pain location: R posterior knee, lower thigh and upper calf Pain description: muscle spasm/cramp Aggravating factors: recumbent bike and NuStep Relieving factors: ceasing activity   PERTINENT HISTORY:  osteoarthritis - scheduled for L TKA on 02/14/24, bunion surgery  PRECAUTIONS: None  RED FLAGS: None  WEIGHT BEARING RESTRICTIONS: No  FALLS:  Has patient fallen in last 6 months? Yes. Number of falls 1 in past 6 months, 2 in past year - both tripping over something  LIVING ENVIRONMENT: Lives with: lives with their spouse Lives in: House Stairs: Yes: Internal: 14 steps; on right going up and External: 4 steps; bilateral but cannot reach both Has following equipment at home: Crutches  OCCUPATION: FT - Marketing - mostly deskwork  PLOF: Independent and Leisure: walking, riding bike, exercise 3-4x/wk   PATIENT GOALS: To get stronger before TKA surgery.   OBJECTIVE:  (objective measures completed at initial evaluation unless otherwise dated)  DIAGNOSTIC FINDINGS:  11/11/23 - XR L knee: Left knee: Tricompartmental arthritis.  No acute fractures.  Near  bone-on-bone medial compartment.  Severe patellofemoral changes with large  osteophytes off the superior and inferior inferior poles.  Knee is well  located.  No acute fractures.   PATIENT SURVEYS:  LEFS  Extreme difficulty/unable (0), Quite a bit of difficulty (1), Moderate difficulty (2), Little difficulty (3), No difficulty (4) Survey date:  11/27/23  Any of your usual work, housework or school activities 4  2. Usual hobbies, recreational or sporting activities 4  3. Getting into/out of the bath 4  4. Walking between rooms 4  5. Putting on socks/shoes 3  6. Squatting  3  7. Lifting an object, like a bag of groceries from the floor 3  8. Performing light activities around your home 4  9. Performing heavy activities around your home 3  10. Getting into/out of a car 4  11. Walking 2 blocks 3  12. Walking 1 mile 3  13. Going up/down 10 stairs (1 flight) 3  14. Standing for 1 hour 3  15.  sitting for 1 hour 3  16. Running on even ground 2  17. Running on uneven ground 2  18. Making sharp turns while running fast 0  19. Hopping  3  20. Rolling over in bed 4  Score total:  62 / 80 = 77.5 %  Functional limitation: Minimal    COGNITION: Overall cognitive status: Within functional limits for tasks assessed    SENSATION: WFL  EDEMA:  Circumferential:   R: mid patella - 39.0 cm L: mid patella - 40.5 cm  POSTURE:  L knee slightly flexed in stance  PALPATION: Limited patellar mobility L>R  Increased muscle tension in L lateral quad with TPs mid VL  MUSCLE LENGTH: Hamstrings: mod tight L, mild tight R ITB: mod tight L, mild tight R Piriformis: mod tight B Hip flexors: mod/severe tight  L, mod tight R Quads: mod/severe tight L, mod tight R Heelcord: WFL  LOWER EXTREMITY ROM:  A/PROM  Right eval Left eval 12/25/23  Knee flexion 139 106 123  Knee extension 0 / -2 hyperextension when supported 8 LAQ / 3 supported 6 LAQ/ supported 3  (Blank rows = not tested)  LOWER EXTREMITY MMT:  MMT Right eval Left eval R 01/15/24 L 01/15/24  Hip flexion 4+ 4 5 5   Hip extension 4+ 4+ 5 5  Hip abduction 5 4+ 5 5  Hip adduction 4+ 4 5 5   Hip internal rotation 5 4+ 5 5  Hip external rotation 4+ 4 5 5   Knee flexion 5 4+ 5 5  Knee extension 5 4+ 5 5  Ankle dorsiflexion 4+ 4+ 5 5  Ankle plantarflexion      Ankle inversion      Ankle eversion       (Blank rows = not tested)  FUNCTIONAL TESTS: (12/10/23) 5 times sit to stand: 11.56 sec  Timed up and go (TUG): 7.31 sec  10 meter walk test: 6.34 sec  Gait speed: 5.17 ft/sec  GAIT: Distance walked: Clinic distances Assistive device utilized: None Level of assistance: Complete Independence Gait pattern: decreased stance time- Left, knee flexed in stance- Left, and antalgic   TODAY'S TREATMENT:  01/15/2024  THERAPEUTIC EXERCISE: To improve strength, endurance, ROM, and flexibility.   Rec Bike - L2 x 6' LE MMT Assessed stairs- WFL but uses HR for balance  NEUROMUSCULAR RE-EDUCATION: To improve coordination, kinesthesia, posture, and proprioception.  Toe tap + SLS with colored dots BLE 4x all the way around BOSU step ups BLE x 10 with support Standing balance on BOSU ball intermittent after each step up with above exercise  Squats on upside down BOSU with support x 10 Tandem gait on balance beam 3x fwd/back Sidestep EC on balance beam 3x back/forth SLS R/L on ground x 30'; 2x30' on airex  12/27/2023  THERAPEUTIC EXERCISE: To improve strength, endurance, ROM, and flexibility.   Rec Bike - L2 x 6'  SELF CARE:  Provided education on post-surgical POC and safety with use of RW and SPC for assistive device(s) post-op. Provided instruction in height adjustment for RW and cane as well as proper sequencing and gait pattern with  respective AD for immediate post-op period. Informed patient that needed DME typically provided prior to discharge from hospital, however she can inquire with MD for order if she wanted to work on obtaining DME ahead of time.  GAIT TRAINING: To improve safety with RW and SPC. 85' with RW/2WW demonstrating step-to pattern for immediate post-op period while nerve block still active progressing to step-through pattern as sensation returns, providing cues for posture and RW proximity with step placement between rear wheels of RW. Stair training up/down 1 flight with reciprocal pattern as tolerated in pre-op period as well as step-to pattern up with the good, down with the bad for immediate post-op mobility.  NEUROMUSCULAR RE-EDUCATION: To improve balance, coordination, kinesthesia, posture, proprioception, and reduce fall risk. L SLS + slight knee flexion & 5-way tap to colored dots x 10  THERAPEUTIC ACTIVITIES: To improve functional performance.  Demonstration, verbal and tactile cues throughout for technique. L 6 fwd step-up + R high knee 2 x 10   12/25/23 THERAPEUTIC EXERCISE: To improve strength, endurance, ROM, and flexibility.  Demonstration, verbal and tactile cues throughout for technique.  Bike L2x72min Heel drop calf stretch from step 2x30' Seated HS stretch hip hinge 2x30' BLE  HEP review and clarification on which exercises to perform daily, every other day, etc THERAPEUTIC ACTIVITIES: To improve functional performance.  Demonstration, verbal and tactile cues throughout for technique. L black TB TKE x 20 L step ups 6 x 20 L lateral step ups 6 x 20 L step downs with toe tap to floor 6' x 20 NEUROMUSCULAR RE-EDUCATION: To improve balance, coordination, kinesthesia, posture, proprioception, and reduce fall risk. R/L SLS + slight knee flexion & 5-way tap to colored dots x 10 standing on foam   12/20/2023  THERAPEUTIC EXERCISE: To improve strength, endurance, ROM, and flexibility.   Demonstration, verbal and tactile cues throughout for technique.  NuStep - L6 x 5' (UE/LE) - time limited by sensation of impending cramp in R posterior leg Calf stretches: Gastroc - runners position x 30 on R Gastroc - toes elevated on 2 block 2 x 30 B - pt noting best stretch Soleus - toes elevated on 2 block 2 x 30 B Gastroc - negative heel off edge of 4 step x 30 B  THERAPEUTIC ACTIVITIES: To improve functional performance.  Demonstration, verbal and tactile cues throughout for technique. L 6 fwd step-up 2 x 10, 2nd set with GTB TKE Standing L GTB TKE 3 x 10 L 6 lateral step-up 2 x 10 L 6 lateral eccentric step-down with light heel tap to floor 2 x 10 L 6 fwd eccentric step-down with light toe tap to floor 2 x 10  NEUROMUSCULAR RE-EDUCATION: To improve balance, coordination, kinesthesia, posture, proprioception, and reduce fall risk. L SLS + slight knee flexion & 5-way tap to colored dots x 10   12/12/2023  THERAPEUTIC EXERCISE: To improve strength, endurance, ROM, and flexibility.  Demonstration, verbal and tactile cues throughout for technique.  Rec Bike - L2 x 4' (seat #5) - stopped early due to cramping in R HS Nustep L6x77min- no cramping  Standing L ITB stretch at counter x 30'  MANUAL THERAPY: To promote normalized muscle tension, improve joint mobility and/or for pain modulation  Patellar mobs medial and superior glides STM to R adductors, lateral quads  NEUROMUSCULAR RE-EDUCATION: To improve coordination, kinesthesia, posture, and proprioception Standing 4 way hip RTB anchored x 10 BLE Wall squats 4x10' just above 90 deg   12/10/2023  THERAPEUTIC EXERCISE: To improve strength, endurance, ROM, and flexibility.  Demonstration, verbal and tactile cues throughout for technique.  Rec Bike - L3 x 4' (seat #5) - stopped early due to cramping in R HS Seated HS stretch with strap + DF lift for calf stretch 3 x 30 bil Standing quad stretch with strap 3 x 30 L 1/2  kneel lunge hip flexor stretch 2 x 30 Standing hip flexor stretch with lower leg supported on chair x 30 Seated lunge position hip flexor stretch x 30 Seated L knee flexion/extension heel slides with foot on sliding disc x 10 Seated L LAQ 3# 2 x 10 Hooklying L quad set + SLR 3# 2 x 10 L quad set with towel roll under heel 10 x 5 Supine L heel slide with strap to increase L knee flexion x 10 - patient noting difficulty initiating straightening of knee after full flexion   11/27/2023 - Eval SELF CARE:  Reviewed eval findings and role of PT in addressing identified deficits as well as instruction in initial HEP (see below).    PATIENT EDUCATION:  Education details: HEP update  Person educated: Patient Education method: Explanation, Demonstration, Verbal cues, and MedBridgeGO app updated Education comprehension: verbalized understanding,  returned demonstration, verbal cues required, and needs further education  HOME EXERCISE PROGRAM: *Pt using MedBridgeGO app.  Access Code: [email or web address] URL: [email or web address] Date: 01/15/2024 Prepared by: Chastelyn Athens  Exercises - Gastroc Stretch on Book  - 2 x daily - 7 x weekly - 3 reps - 30 sec hold - Seated Hamstring Stretch with Strap  - 2 x daily - 7 x weekly - 3 reps - 30 sec hold - Standing Quad Stretch with Strap  - 2 x daily - 7 x weekly - 3 reps - 30 sec hold - Standing Hip Flexor Stretch on Chair  - 2 x daily - 7 x weekly - 3 reps - 30 sec hold - Quad Setting and Stretching  - 1 x daily - 7 x weekly - 2 sets - 10 reps - 3-5 sec hold - Active Straight Leg Raise with Quad Set  - 1 x daily - 7 x weekly - 2 sets - 10 reps - 3-5 sec hold - Seated Long Arc Quad with Ankle Weight  - 1 x daily - 7 x weekly - 2 sets - 10 reps - 3 sec hold - Supine Heel Slide with Strap  - 1 x daily - 7 x weekly - 2 sets - 10 reps - 3 sec hold - Standing Hip Abduction with Anchored Resistance  - 1 x daily - 7 x weekly - 3 sets - 10 reps -  Standing Hip Flexion with Anchored Resistance  - 1 x daily - 7 x weekly - 3 sets - 10 reps - Standing Hip Extension and Flexion with Resistance  - 1 x daily - 7 x weekly - 3 sets - 10 reps - Standing Hip Adduction with Anchored Resistance  - 1 x daily - 7 x weekly - 3 sets - 10 reps - Standing Terminal Knee Extension with Resistance  - 1 x daily - 7 x weekly - 2 sets - 10 reps - 5 sec hold - Single Leg Stance on Foam Pad  - 1 x daily - 7 x weekly - 2 sets - 2 reps - 30 seconds hold - Single Leg Stance with 3-Way Kick on Foam  - 1 x daily - 7 x weekly - 2 sets - 10 reps   ASSESSMENT:  CLINICAL IMPRESSION: Pt demonstrates 5/5 strength in all muscles and stair navigation WFL. She notes more challenges with balance so we worked on AUTOM[employer]IC [employer] activities as tolerated. Pt shows good single leg balance and narrow BOS balance on solid ground but challenged with complaint surfaces. She is progressing well, would recommend continued work on balance but also prep for upcoming TKA surgery. Quinnie will benefit from continued skilled PT to address ongoing flexibility/ROM and strength deficits to improve mobility and activity tolerance with decreased pain interference in preparation for planned TKA on 02/14/2024.   EVAL: AHJA MARTELLO is a 68 y.o. female who was referred to physical therapy for evaluation and treatment for chronic L knee pain secondary to advanced L knee osteoarthritis.  She is planning to undergo L TKA in early January 2026 and requested PT to help her prepare for surgery.   Patient reports chronic L knee pain, worse with walking and climbing stairs.  Patient has deficits in L knee ROM, L>R LE flexibility, L>R strength, abnormal posture with L knee slightly flexed in stance, and TTP with abnormal muscle tension and lateral quads and hamstrings which are interfering with ADLs and are impacting quality of life.  On LEFS patient  scored 62/80 demonstrating minimal functional limitation.  Amberly will benefit from  skilled PT to address above deficits to improve mobility and activity tolerance with decreased pain interference.  OBJECTIVE IMPAIRMENTS: Abnormal gait, decreased activity tolerance, decreased balance, decreased knowledge of use of DME, decreased mobility, difficulty walking, decreased ROM, decreased strength, increased edema, increased fascial restrictions, impaired perceived functional ability, increased muscle spasms, impaired flexibility, postural dysfunction, and pain.   ACTIVITY LIMITATIONS: carrying, lifting, sitting, standing, squatting, sleeping, stairs, transfers, and locomotion level  PARTICIPATION LIMITATIONS: cleaning and community activity  PERSONAL FACTORS: Past/current experiences, Time since onset of injury/illness/exacerbation, and 1-2 comorbidities: osteoarthritis, bunion surgery are also affecting patient's functional outcome.   REHAB POTENTIAL: Excellent  CLINICAL DECISION MAKING: Evolving/moderate complexity  EVALUATION COMPLEXITY: Moderate   GOALS: Goals reviewed with patient? Yes  SHORT TERM GOALS: Target date: 12/25/2023  Patient will be independent with initial HEP. Baseline: Initial HEP provided eval 12/10/23 - HEP reviewed and updated 12/20/23 - HEP reviewed and updated Goal status: MET - 12/27/23  2.  Patient will report at least 10-25% improvement in L proximal LE pain to improve QOL. Baseline: 3/10 Goal status: MET - 01/15/24  3.  Patient will improve 5x STS time to </= 15 seconds to demonstrate improved functional strength and transfer efficiency. Baseline: TBA Goal status: MET - 12/10/23 - 11.56 sec   LONG TERM GOALS: Target date: 01/22/2024   Patient will be independent with advanced/ongoing HEP to improve outcomes and carryover.  Baseline:  Goal status: IN PROGRESS - 12/27/23  2.  Patient will report at least 25-50% improvement in L knee/proximal LE pain to improve QOL. Baseline: 3/10 Goal status: INITIAL  3.  Patient will demonstrate  improved L knee AROM to >/= 3-120 deg to allow for normal gait and stair mechanics. Baseline: Refer to above LE ROM table Goal status: MET - 12/25/23 - L knee AROM 3-123  4.  Patient will demonstrate improved B LE strength to >/= 4+/5 for improved stability and ease of mobility. Baseline: Refer to above LE MMT table Goal status: MET- 01/15/24  5.  Patient will be able to demonstrate proper gait pattern with RW to prepare for post-op mobility following TKA.  Baseline: currently not using AD Goal status: IN PROGRESS - 12/27/23 - initial training provided  6. Patient will be able to ascend/descend stairs with 1 HR and step-to pattern safely to access home and community after TKA.  Baseline:  Goal status: MET - 01/15/24  7.  Patient will report >/= 67/80 on LEFS to demonstrate improved functional ability. Baseline: 62 / 80 = 77.5 % Goal status: INITIAL  8.  Patient will demonstrate at least 19/24 on DGI to decrease risk of falls. Baseline:  Goal status: INITIAL   9.  Patient will verbalize understanding of expected post-op rehab program including initial post-op HEP. Baseline:  Goal status: INITIAL   PLAN:  PT FREQUENCY: 1-2x/week  PT DURATION: 6 weeks  PLANNED INTERVENTIONS: 02835- PT Re-evaluation, 97750- Physical Performance Testing, 97110-Therapeutic exercises, 97530- Therapeutic activity, [id number]- Neuromuscular re-education, 97535- Self Care, 02859- Manual therapy, [phone]- Gait training, [phone]- Aquatic Therapy, [phone]- Electrical stimulation (unattended), [id number]- Vasopneumatic device, 97035- Ultrasound, 02966- Ionotophoresis 4mg /ml Dexamethasone , Patient/Family education, Balance training, Stair training, Taping, Joint mobilization, DME instructions, Cryotherapy, and Moist heat  PLAN FOR NEXT SESSION: Progress L knee ROM exercises as well as LE strengthening to help prepare for upcoming TKR surgery - reviewing and updating HEP as indicated; review education in proper use of RW as AD  for postop mobility including safe transfer techniques, proper gait pattern and stair negotiation    Sol LITTIE Gaskins, PTA 01/15/2024, 9:24 AM

## 2024-01-17 ENCOUNTER — Encounter: Payer: Self-pay | Admitting: Physical Therapy

## 2024-01-17 ENCOUNTER — Ambulatory Visit: Admitting: Physical Therapy

## 2024-01-17 DIAGNOSIS — R262 Difficulty in walking, not elsewhere classified: Secondary | ICD-10-CM

## 2024-01-17 DIAGNOSIS — M25662 Stiffness of left knee, not elsewhere classified: Secondary | ICD-10-CM

## 2024-01-17 DIAGNOSIS — R600 Localized edema: Secondary | ICD-10-CM

## 2024-01-17 DIAGNOSIS — G8929 Other chronic pain: Secondary | ICD-10-CM

## 2024-01-17 DIAGNOSIS — M25562 Pain in left knee: Secondary | ICD-10-CM | POA: Diagnosis not present

## 2024-01-17 DIAGNOSIS — M62838 Other muscle spasm: Secondary | ICD-10-CM

## 2024-01-17 DIAGNOSIS — M6281 Muscle weakness (generalized): Secondary | ICD-10-CM

## 2024-01-17 NOTE — Therapy (Signed)
 OUTPATIENT PHYSICAL THERAPY LOWER EXTREMITY TREATMENT   Patient Name: Kathleen Meyers MRN: [id number] DOB:01/31/1956, 68 y.o., female Today's Date: 01/17/2024   END OF SESSION:  PT End of Session - 01/17/24 0804     Visit Number 8    Date for Recertification  01/22/24    Authorization Type Medicare & AARP    Progress Note Due on Visit 10    PT Start Time 0804    PT Stop Time 0855    PT Time Calculation (min) 51 min    Activity Tolerance Patient tolerated treatment well    Behavior During Therapy [facility] for tasks assessed/performed             History reviewed. No pertinent past medical history. Past Surgical History:  Procedure Laterality Date   BREAST CYST ASPIRATION Left    DILATION AND CURETTAGE OF UTERUS     ENT surgery as a child      HERNIA REPAIR     umbilical as a child    infertility surgery      LAPAROSCOPIC ASSISTED VAGINAL HYSTERECTOMY N/A 08/05/2014   Procedure: LAPAROSCOPIC ASSISTED VAGINAL HYSTERECTOMY;  Surgeon: Truman Corona, MD;  Location: [facility];  Service: Gynecology;  Laterality: N/A;   SALPINGOOPHORECTOMY Bilateral 08/05/2014   Procedure: BILATERAL SALPINGO OOPHORECTOMY;  Surgeon: Truman Corona, MD;  Location: [facility];  Service: Gynecology;  Laterality: Bilateral;   Patient Active Problem List   Diagnosis Date Noted   Unilateral primary osteoarthritis, left knee 04/03/2023   Fibroids 08/05/2014   Bunion 11/12/2006   FOOT PAIN, RIGHT 11/12/2006   MUSCLE STRAIN, LEFT BUTTOCK 07/16/2006    PCP: Chrystal Lamarr RAMAN, MD   REFERRING PROVIDER: Gretta Bertrum ORN, PA-C   REFERRING DIAG: [phone] (ICD-10-CM) - Chronic pain of left knee   THERAPY DIAG:  Chronic pain of left knee  Stiffness of left knee, not elsewhere classified  Muscle weakness (generalized)  Difficulty in walking, not elsewhere classified  Localized edema  Other muscle spasm  RATIONALE FOR EVALUATION AND TREATMENT: Rehabilitation  ONSET DATE: Chronic   NEXT  MD VISIT: 02/27/2024 (post-op) with Lonni Poli, MD    SUBJECTIVE:                                                                                                                                                                                                         SUBJECTIVE STATEMENT: Pt reports she can feel my knee today but not really painful.  EVAL: Pt reports advanced OA in her L knee for which she is planning to have TKA  in early January 2026.  She requested PT now to help with her walking, esp up stairs, and make sure she is doing the right exercises to prepare for surgery.  She feels like she is now starting to have more nervy pain in her thigh. Also has been noting more of a feeling of the knee wanting to give way while walking which will stop her in tracks.  PAIN: Are you having pain? Yes: NPRS scale: 1-2/10  Pain location: L knee  Pain description: achy, tense when walking  Aggravating factors: walking, stairs Relieving factors: Aleve or Tylenol , elevation   Are you having pain? Yes: NPRS scale:  0/10 Pain location: R posterior knee, lower thigh and upper calf Pain description: muscle spasm/cramp Aggravating factors: recumbent bike and NuStep Relieving factors: ceasing activity, keeping toes DF   PERTINENT HISTORY:  osteoarthritis - scheduled for L TKA on 02/14/24, bunion surgery  PRECAUTIONS: None  RED FLAGS: None  WEIGHT BEARING RESTRICTIONS: No  FALLS:  Has patient fallen in last 6 months? Yes. Number of falls 1 in past 6 months, 2 in past year - both tripping over something  LIVING ENVIRONMENT: Lives with: lives with their spouse Lives in: House Stairs: Yes: Internal: 14 steps; on right going up and External: 4 steps; bilateral but cannot reach both Has following equipment at home: Crutches  OCCUPATION: FT - Marketing - mostly deskwork  PLOF: Independent and Leisure: walking, riding bike, exercise 3-4x/wk   PATIENT GOALS: To get stronger  before TKA surgery.   OBJECTIVE: (objective measures completed at initial evaluation unless otherwise dated)  DIAGNOSTIC FINDINGS:  11/11/23 - XR L knee: Left knee: Tricompartmental arthritis.  No acute fractures.  Near  bone-on-bone medial compartment.  Severe patellofemoral changes with large  osteophytes off the superior and inferior inferior poles.  Knee is well  located.  No acute fractures.   PATIENT SURVEYS:  LEFS  Extreme difficulty/unable (0), Quite a bit of difficulty (1), Moderate difficulty (2), Little difficulty (3), No difficulty (4) Survey date:  11/27/23  Any of your usual work, housework or school activities 4  2. Usual hobbies, recreational or sporting activities 4  3. Getting into/out of the bath 4  4. Walking between rooms 4  5. Putting on socks/shoes 3  6. Squatting  3  7. Lifting an object, like a bag of groceries from the floor 3  8. Performing light activities around your home 4  9. Performing heavy activities around your home 3  10. Getting into/out of a car 4  11. Walking 2 blocks 3  12. Walking 1 mile 3  13. Going up/down 10 stairs (1 flight) 3  14. Standing for 1 hour 3  15.  sitting for 1 hour 3  16. Running on even ground 2  17. Running on uneven ground 2  18. Making sharp turns while running fast 0  19. Hopping  3  20. Rolling over in bed 4  Score total:  62 / 80 = 77.5 %  Functional limitation: Minimal    COGNITION: Overall cognitive status: Within functional limits for tasks assessed    SENSATION: WFL  EDEMA:  Circumferential:   R: mid patella - 39.0 cm L: mid patella - 40.5 cm  POSTURE:  L knee slightly flexed in stance  PALPATION: Limited patellar mobility L>R  Increased muscle tension in L lateral quad with TPs mid VL  MUSCLE LENGTH: Hamstrings: mod tight L, mild tight R ITB: mod tight L, mild tight R Piriformis:  mod tight B Hip flexors: mod/severe tight L, mod tight R Quads: mod/severe tight L, mod tight R Heelcord:  WFL  LOWER EXTREMITY ROM:  A/PROM Right eval Left eval L 12/25/23 L 01/17/24  Knee flexion 139 106 123 124  Knee extension 0 / -2 hyperextension when supported 8 LAQ / 3 supported 6 LAQ/ supported 3 3 LAQ/ supported 0  (Blank rows = not tested)  LOWER EXTREMITY MMT:  MMT Right eval Left eval R 01/15/24 L 01/15/24  Hip flexion 4+ 4 5 5   Hip extension 4+ 4+ 5 5  Hip abduction 5 4+ 5 5  Hip adduction 4+ 4 5 5   Hip internal rotation 5 4+ 5 5  Hip external rotation 4+ 4 5 5   Knee flexion 5 4+ 5 5  Knee extension 5 4+ 5 5  Ankle dorsiflexion 4+ 4+ 5 5  Ankle plantarflexion      Ankle inversion      Ankle eversion       (Blank rows = not tested)  FUNCTIONAL TESTS: (12/10/23) 5 times sit to stand: 11.56 sec  Timed up and go (TUG): 7.31 sec  10 meter walk test: 6.34 sec  Gait speed: 5.17 ft/sec  GAIT: Distance walked: Clinic distances Assistive device utilized: None Level of assistance: Complete Independence Gait pattern: decreased stance time- Left, knee flexed in stance- Left, and antalgic   TODAY'S TREATMENT:    01/17/2024  THERAPEUTIC EXERCISE: To improve strength, endurance, ROM, and flexibility.    NuStep - L6 x 6' (UE/LE)  SELF CARE: Provided education on post-surgical exercises, on strategies for edema management, and for HEP review including recommended frequency for home performance.  Identified and provided instruction in typical immediate post-op TKA exercises including antithrombotic exercises and early knee ROM exercises with recommended frequency for post-op performance: supine ankle pumps in elevation on pillows  - 3-5 x daily - 7 x weekly - 2 sets - 10 reps - 3 sec hold quad setting and stretching  - 3-5 x daily - 7 x weekly - 2 sets - 10 reps - 3-5 sec hold gluteal sets  - 3-5 x daily - 7 x weekly - 2 sets - 10 reps - 3 sec hold supine heel slide with strap  - 3 x daily - 7 x weekly - 2 sets - 10 reps - 3 sec hold supine hip abduction  - 3 x daily - 7 x  weekly - 2 sets - 10 reps - 3 sec hold active straight leg raise with quad set  - 3 x daily - 7 x weekly - 2 sets - 10 reps - 3-5 sec hold seated heel slide  - 3 x daily - 7 x weekly - 2 sets - 10 reps - 3 sec hold seated long arc quad with ankle weight  - 3 x daily - 7 x weekly - 2 sets - 10 reps - 3 sec hold Verbally reviewed remainder of current HEP clarifying recommended frequency for ongoing performance to maintain gains achieved with PT and prevent future decline in function.  Answered patient's questions regarding use of ice post-op as well as compression devices vs compression stockings  NEUROMUSCULAR RE-EDUCATION: To improve balance, coordination, kinesthesia, posture, proprioception, and reduce fall risk. SLS on blue foam oval + GTB 4 way hip SLR x 10 B LE, intermittent UE support on back of chair for balance   01/15/2024  THERAPEUTIC EXERCISE: To improve strength, endurance, ROM, and flexibility.   Rec Bike - L2  x 6' LE MMT Assessed stairs- WFL but uses HR for balance  NEUROMUSCULAR RE-EDUCATION: To improve coordination, kinesthesia, posture, and proprioception.  Toe tap + SLS with colored dots BLE 4x all the way around BOSU step ups BLE x 10 with support Standing balance on BOSU ball intermittent after each step up with above exercise  Squats on upside down BOSU with support x 10 Tandem gait on balance beam 3x fwd/back Sidestep EC on balance beam 3x back/forth SLS R/L on ground x 30'; 2x30' on airex   12/27/2023  THERAPEUTIC EXERCISE: To improve strength, endurance, ROM, and flexibility.   Rec Bike - L2 x 6'  SELF CARE:  Provided education on post-surgical POC and safety with use of RW and SPC for assistive device(s) post-op. Provided instruction in height adjustment for RW and cane as well as proper sequencing and gait pattern with respective AD for immediate post-op period. Informed patient that needed DME typically provided prior to discharge from hospital, however  she can inquire with MD for order if she wanted to work on obtaining DME ahead of time.  GAIT TRAINING: To improve safety with RW and SPC. 60' with RW/2WW demonstrating step-to pattern for immediate post-op period while nerve block still active progressing to step-through pattern as sensation returns, providing cues for posture and RW proximity with step placement between rear wheels of RW. Stair training up/down 1 flight with reciprocal pattern as tolerated in pre-op period as well as step-to pattern up with the good, down with the bad for immediate post-op mobility.  NEUROMUSCULAR RE-EDUCATION: To improve balance, coordination, kinesthesia, posture, proprioception, and reduce fall risk. L SLS + slight knee flexion & 5-way tap to colored dots x 10  THERAPEUTIC ACTIVITIES: To improve functional performance.  Demonstration, verbal and tactile cues throughout for technique. L 6 fwd step-up + R high knee 2 x 10   12/25/23 THERAPEUTIC EXERCISE: To improve strength, endurance, ROM, and flexibility.  Demonstration, verbal and tactile cues throughout for technique.  Bike L2x65min Heel drop calf stretch from step 2x30' Seated HS stretch hip hinge 2x30' BLE HEP review and clarification on which exercises to perform daily, every other day, etc THERAPEUTIC ACTIVITIES: To improve functional performance.  Demonstration, verbal and tactile cues throughout for technique. L black TB TKE x 20 L step ups 6 x 20 L lateral step ups 6 x 20 L step downs with toe tap to floor 6' x 20 NEUROMUSCULAR RE-EDUCATION: To improve balance, coordination, kinesthesia, posture, proprioception, and reduce fall risk. R/L SLS + slight knee flexion & 5-way tap to colored dots x 10 standing on foam   12/20/2023  THERAPEUTIC EXERCISE: To improve strength, endurance, ROM, and flexibility.  Demonstration, verbal and tactile cues throughout for technique.  NuStep - L6 x 5' (UE/LE) - time limited by sensation of impending  cramp in R posterior leg Calf stretches: Gastroc - runners position x 30 on R Gastroc - toes elevated on 2 block 2 x 30 B - pt noting best stretch Soleus - toes elevated on 2 block 2 x 30 B Gastroc - negative heel off edge of 4 step x 30 B  THERAPEUTIC ACTIVITIES: To improve functional performance.  Demonstration, verbal and tactile cues throughout for technique. L 6 fwd step-up 2 x 10, 2nd set with GTB TKE Standing L GTB TKE 3 x 10 L 6 lateral step-up 2 x 10 L 6 lateral eccentric step-down with light heel tap to floor 2 x 10 L 6 fwd eccentric step-down with  light toe tap to floor 2 x 10  NEUROMUSCULAR RE-EDUCATION: To improve balance, coordination, kinesthesia, posture, proprioception, and reduce fall risk. L SLS + slight knee flexion & 5-way tap to colored dots x 10   12/12/2023  THERAPEUTIC EXERCISE: To improve strength, endurance, ROM, and flexibility.  Demonstration, verbal and tactile cues throughout for technique.  Rec Bike - L2 x 4' (seat #5) - stopped early due to cramping in R HS Nustep L6x97min- no cramping  Standing L ITB stretch at counter x 30'  MANUAL THERAPY: To promote normalized muscle tension, improve joint mobility and/or for pain modulation  Patellar mobs medial and superior glides STM to R adductors, lateral quads  NEUROMUSCULAR RE-EDUCATION: To improve coordination, kinesthesia, posture, and proprioception Standing 4 way hip RTB anchored x 10 BLE Wall squats 4x10' just above 90 deg   12/10/2023  THERAPEUTIC EXERCISE: To improve strength, endurance, ROM, and flexibility.  Demonstration, verbal and tactile cues throughout for technique.  Rec Bike - L3 x 4' (seat #5) - stopped early due to cramping in R HS Seated HS stretch with strap + DF lift for calf stretch 3 x 30 bil Standing quad stretch with strap 3 x 30 L 1/2 kneel lunge hip flexor stretch 2 x 30 Standing hip flexor stretch with lower leg supported on chair x 30 Seated lunge position hip  flexor stretch x 30 Seated L knee flexion/extension heel slides with foot on sliding disc x 10 Seated L LAQ 3# 2 x 10 Hooklying L quad set + SLR 3# 2 x 10 L quad set with towel roll under heel 10 x 5 Supine L heel slide with strap to increase L knee flexion x 10 - patient noting difficulty initiating straightening of knee after full flexion   11/27/2023 - Eval SELF CARE:  Reviewed eval findings and role of PT in addressing identified deficits as well as instruction in initial HEP (see below).    PATIENT EDUCATION:  Education details: HEP review, HEP update - identified immediate post-op TKA exercises, HEP consolidation, and recommended frequency for ongoing HEP at discharge to prevent loss of gains achieved with PT  Person educated: Patient Education method: Explanation, Demonstration, Verbal cues, and MedBridge[employer] app updated Education comprehension: verbalized understanding, returned demonstration, verbal cues required, and needs further education  HOME EXERCISE PROGRAM: *Pt using MedBridge[employer] app.  Access Code: [email or web address] URL: [email or web address] Date: 01/17/2024 Prepared by: Elijah Hidden  Exercises - Supine Ankle Pumps in Elevation on Pillows  - 3-5 x daily - 7 x weekly - 2 sets - 10 reps - 3 sec hold - Quad Setting and Stretching  - 3-5 x daily - 7 x weekly - 2 sets - 10 reps - 3-5 sec hold - Gluteal Sets  - 3-5 x daily - 7 x weekly - 2 sets - 10 reps - 3 sec hold - Supine Heel Slide with Strap  - 3 x daily - 7 x weekly - 2 sets - 10 reps - 3 sec hold - Supine Hip Abduction  - 3 x daily - 7 x weekly - 2 sets - 10 reps - 3 sec hold - Active Straight Leg Raise with Quad Set  - 3 x daily - 7 x weekly - 2 sets - 10 reps - 3-5 sec hold - Seated Heel Slide  - 3 x daily - 7 x weekly - 2 sets - 10 reps - 3 sec hold - Seated Long Arc Quad with Ankle Weight  - 3 x daily -  7 x weekly - 2 sets - 10 reps - 3 sec hold - Gastroc Stretch on Book  - 2 x daily - 7 x weekly - 3  reps - 30 sec hold - Seated Hamstring Stretch with Strap  - 2 x daily - 7 x weekly - 3 reps - 30 sec hold - Standing Quad Stretch with Strap  - 2 x daily - 7 x weekly - 3 reps - 30 sec hold - Standing Hip Flexor Stretch on Chair  - 2 x daily - 7 x weekly - 3 reps - 30 sec hold - Standing Hip Flexion with Anchored Resistance  - 1 x daily - 3 x weekly - 3 sets - 10 reps - Standing Hip Adduction with Anchored Resistance  - 1 x daily - 3 x weekly - 3 sets - 10 reps - Standing Hip Extension with Anchored Resistance  - 1 x daily - 3 x weekly - 2 sets - 10 reps - 3 sec hold - Standing Hip Abduction with Anchored Resistance  - 1 x daily - 3 x weekly - 3 sets - 10 reps - Standing Terminal Knee Extension with Resistance  - 1 x daily - 7 x weekly - 2 sets - 10 reps - 5 sec hold - Single Leg Stance on Foam Pad  - 1 x daily - 3 x weekly - 2 sets - 2 reps - 30 seconds hold - Single Leg Stance with 3-Way Kick on Foam  - 1 x daily - 3 x weekly - 2 sets - 10 reps   ASSESSMENT:  CLINICAL IMPRESSION: HEP reviewed, separating out immediate post-op TKA exercise vs ongoing current HEP exercises to maintain recent gains with PT until surgery.  Progressed complexity of standing GTB 4-way SLR adding foam cushion to promote improved balance and proprioception while maintaining proximal LE strength/stability.  One visit remaining in her prehab PT episode - will plan for final review of equipment/DME use post-op as well as final goal assessment.  Anticipate she should be ready to transition to her HEP until her scheduled TKA surgery on 02/14/2024.   EVAL: JOZALYNN NOYCE is a 68 y.o. female who was referred to physical therapy for evaluation and treatment for chronic L knee pain secondary to advanced L knee osteoarthritis.  She is planning to undergo L TKA in early January 2026 and requested PT to help her prepare for surgery.   Patient reports chronic L knee pain, worse with walking and climbing stairs.  Patient has deficits  in L knee ROM, L>R LE flexibility, L>R strength, abnormal posture with L knee slightly flexed in stance, and TTP with abnormal muscle tension and lateral quads and hamstrings which are interfering with ADLs and are impacting quality of life.  On LEFS patient scored 62/80 demonstrating minimal functional limitation.  Javeria will benefit from skilled PT to address above deficits to improve mobility and activity tolerance with decreased pain interference.  OBJECTIVE IMPAIRMENTS: Abnormal gait, decreased activity tolerance, decreased balance, decreased knowledge of use of DME, decreased mobility, difficulty walking, decreased ROM, decreased strength, increased edema, increased fascial restrictions, impaired perceived functional ability, increased muscle spasms, impaired flexibility, postural dysfunction, and pain.   ACTIVITY LIMITATIONS: carrying, lifting, sitting, standing, squatting, sleeping, stairs, transfers, and locomotion level  PARTICIPATION LIMITATIONS: cleaning and community activity  PERSONAL FACTORS: Past/current experiences, Time since onset of injury/illness/exacerbation, and 1-2 comorbidities: osteoarthritis, bunion surgery are also affecting patient's functional outcome.   REHAB POTENTIAL: Excellent  CLINICAL DECISION MAKING:  Evolving/moderate complexity  EVALUATION COMPLEXITY: Moderate   GOALS: Goals reviewed with patient? Yes  SHORT TERM GOALS: Target date: 12/25/2023  Patient will be independent with initial HEP. Baseline: Initial HEP provided eval 12/10/23 - HEP reviewed and updated 12/20/23 - HEP reviewed and updated Goal status: MET - 12/27/23  2.  Patient will report at least 10-25% improvement in L proximal LE pain to improve QOL. Baseline: 3/10 Goal status: MET - 01/15/24  3.  Patient will improve 5x STS time to </= 15 seconds to demonstrate improved functional strength and transfer efficiency. Baseline: TBA Goal status: MET - 12/10/23 - 11.56 sec   LONG TERM  GOALS: Target date: 01/22/2024   Patient will be independent with advanced/ongoing HEP to improve outcomes and carryover.  Baseline:  12/27/23 - in progress Goal status: IN PROGRESS - 01/17/24 - HEP reviewed, separating out immediate post-op TKA exercise vs ongoing current HEP exercises to maintain recent gains with PT until surgery  2.  Patient will report at least 25-50% improvement in L knee/proximal LE pain to improve QOL. Baseline: 3/10 Goal status: MET - 01/17/24 - 70% improvement in pain on average  3.  Patient will demonstrate improved L knee AROM to >/= 3-120 deg to allow for normal gait and stair mechanics. Baseline: Refer to above LE ROM table 12/25/23 - L knee AROM 3-123 (Goal met) Goal status: MET - 01/17/24 - L knee AROM 3-124, extension to 0 when LE supported  4.  Patient will demonstrate improved B LE strength to >/= 4+/5 for improved stability and ease of mobility. Baseline: Refer to above LE MMT table Goal status: MET - 01/15/24  5.  Patient will be able to demonstrate proper gait pattern with RW to prepare for post-op mobility following TKA.  Baseline: currently not using AD Goal status: IN PROGRESS - 12/27/23 - initial training provided  6. Patient will be able to ascend/descend stairs with 1 HR and step-to pattern safely to access home and community after TKA.  Baseline:  Goal status: MET - 01/15/24  7.  Patient will report >/= 67/80 on LEFS to demonstrate improved functional ability. Baseline: 62 / 80 = 77.5 % Goal status: INITIAL  8.  Patient will demonstrate at least 19/24 on DGI to decrease risk of falls. Baseline:  Goal status: INITIAL   9.  Patient will verbalize understanding of expected post-op rehab program including initial post-op HEP. Baseline:  Goal status: INITIAL   PLAN:  PT FREQUENCY: 1-2x/week  PT DURATION: 6 weeks  PLANNED INTERVENTIONS: 02835- PT Re-evaluation, 97750- Physical Performance Testing, 97110-Therapeutic exercises,  97530- Therapeutic activity, [id number]- Neuromuscular re-education, 97535- Self Care, 02859- Manual therapy, [phone]- Gait training, [phone]- Aquatic Therapy, [phone]- Electrical stimulation (unattended), 97016- Vasopneumatic device, 97035- Ultrasound, 02966- Ionotophoresis 4mg /ml Dexamethasone , Patient/Family education, Balance training, Stair training, Taping, Joint mobilization, DME instructions, Cryotherapy, and Moist heat  PLAN FOR NEXT SESSION: LTG assessment; final HEP review PRN; review education in proper use of RW as AD for postop mobility including safe transfer techniques, proper gait pattern and stair negotiation; anticipate transition to HEP + DC from PT    Elijah CHRISTELLA Hidden, PT 01/17/2024, 8:58 AM

## 2024-01-20 ENCOUNTER — Ambulatory Visit: Attending: Cardiovascular Disease

## 2024-01-20 DIAGNOSIS — Z01810 Encounter for preprocedural cardiovascular examination: Secondary | ICD-10-CM | POA: Diagnosis not present

## 2024-01-20 NOTE — Progress Notes (Signed)
 Virtual Visit via Telephone Note   Because of Kathleen Meyers co-morbid illnesses, she is at least at moderate risk for complications without adequate follow up.  This format is felt to be most appropriate for this patient at this time.  Due to technical limitations with video connection (technology), today's appointment will be conducted as an audio only telehealth visit, and Kathleen Meyers verbally agreed to proceed in this manner.   All issues noted in this document were discussed and addressed.  No physical exam could be performed with this format.  Evaluation Performed:  Preoperative cardiovascular risk assessment _____________   Date:  01/20/2024   Patient ID:  Kathleen Meyers, DOB Aug 28, 1955, MRN [id number] Patient Location:  Home Provider location:   Office  Primary Care Provider:  Chrystal Lamarr RAMAN, MD Primary Cardiologist:  None  Chief Complaint / Patient Profile   68 y.o. y/o female with a h/o dyslipidemia, coronary artery calcifications with high calcium  score, and hypertension who is pending left total knee arthroplasty and presents today for telephonic preoperative cardiovascular risk assessment.  History of Present Illness    Kathleen Meyers is a 68 y.o. female who presents via audio/video conferencing for a telehealth visit today.  Pt was last seen in cardiology clinic on 11/26/2023 by Dr. Mona.  At that time Kathleen Meyers was doing well outside of exertional fatigue and dyspnea with recent PET showing normal perfusion and LVEF of 55%.  The patient is now pending procedure as outlined above. Since her last visit, she has been doing well without any additional cardiac issues.  She denies chest pain and shortness of breath.  Her only mobility issues occurred due to her knee.  No limitations due to her heart.  She does meet the minimum METS requirement for clearance.  No medications indicated as needing held.  She can continue her aspirin throughout.  Past Medical  History    No past medical history on file. Past Surgical History:  Procedure Laterality Date   BREAST CYST ASPIRATION Left    DILATION AND CURETTAGE OF UTERUS     ENT surgery as a child      HERNIA REPAIR     umbilical as a child    infertility surgery      LAPAROSCOPIC ASSISTED VAGINAL HYSTERECTOMY N/A 08/05/2014   Procedure: LAPAROSCOPIC ASSISTED VAGINAL HYSTERECTOMY;  Surgeon: Truman Corona, MD;  Location: [facility];  Service: Gynecology;  Laterality: N/A;   SALPINGOOPHORECTOMY Bilateral 08/05/2014   Procedure: BILATERAL SALPINGO OOPHORECTOMY;  Surgeon: Truman Corona, MD;  Location: [facility];  Service: Gynecology;  Laterality: Bilateral;    Allergies  Allergies[1]  Home Medications    Prior to Admission medications  Medication Sig Start Date End Date Taking? Authorizing Provider  amLODipine  (NORVASC ) 5 MG tablet Take 5 mg by mouth daily.    [provider]  atorvastatin  (LIPITOR) 40 MG tablet Take 40 mg by mouth daily.    [provider]  BABY ASPIRIN PO Take 81 mg by mouth daily. 03/25/23   [provider]  therapeutic multivitamin-minerals (THERAGRAN-M) tablet Take 1 tablet by mouth daily.    [provider]    Physical Exam    Vital Signs:  Kathleen Meyers does not have vital signs available for review today.  Given telephonic nature of communication, physical exam is limited. AAOx3. NAD. Normal affect.  Speech and respirations are unlabored.  Accessory Clinical Findings    None  Assessment & Plan  1.  Preoperative Cardiovascular Risk Assessment:  [name]'s perioperative risk of a major cardiac event is 0.4% according to the Revised Cardiac Risk Index (RCRI).  Therefore, she is at low risk for perioperative complications.   Her functional capacity is good at 5.81 METs according to the Duke Activity Status Index (DASI). Recommendations: According to ACC/AHA guidelines, no further cardiovascular testing needed.  The patient  may proceed to surgery at acceptable risk.   Antiplatelet and/or Anticoagulation Recommendations: The patient should remain on Aspirin without interruption.    The patient was advised that if she develops new symptoms prior to surgery to contact our office to arrange for a follow-up visit, and she verbalized understanding.   A copy of this note will be routed to requesting surgeon.  Time:   Today, I have spent 9 minutes with the patient with telehealth technology discussing medical history, symptoms, and management plan.     Orren LOISE Fabry, PA-C  01/20/2024, 7:41 AM      [1] No Known Allergies

## 2024-01-22 ENCOUNTER — Encounter: Payer: Self-pay | Admitting: Physical Therapy

## 2024-01-22 ENCOUNTER — Ambulatory Visit: Admitting: Physical Therapy

## 2024-01-22 DIAGNOSIS — M6281 Muscle weakness (generalized): Secondary | ICD-10-CM

## 2024-01-22 DIAGNOSIS — R262 Difficulty in walking, not elsewhere classified: Secondary | ICD-10-CM

## 2024-01-22 DIAGNOSIS — R600 Localized edema: Secondary | ICD-10-CM

## 2024-01-22 DIAGNOSIS — M25562 Pain in left knee: Secondary | ICD-10-CM | POA: Diagnosis not present

## 2024-01-22 DIAGNOSIS — M25662 Stiffness of left knee, not elsewhere classified: Secondary | ICD-10-CM

## 2024-01-22 DIAGNOSIS — G8929 Other chronic pain: Secondary | ICD-10-CM

## 2024-01-22 DIAGNOSIS — M62838 Other muscle spasm: Secondary | ICD-10-CM

## 2024-01-22 NOTE — Therapy (Signed)
 OUTPATIENT PHYSICAL THERAPY TREATMENT / DISCHARGE SUMMARY  Progress Note  Reporting Period 11/27/2023 to 01/22/2024  See note below for Objective Data and Assessment of Progress/Goals.     Patient Name: Kathleen Meyers MRN: [id number] DOB:[birth year], 68 y.o., female Today's Date: 01/22/2024   END OF SESSION:  PT End of Session - 01/22/24 0803     Visit Number 9    Date for Recertification  01/22/24    Authorization Type Medicare & AARP    Progress Note Due on Visit 10    PT Start Time 0803    PT Stop Time 0846    PT Time Calculation (min) 43 min    Activity Tolerance Patient tolerated treatment well    Behavior During Therapy [facility] for tasks assessed/performed              History reviewed. No pertinent past medical history. Past Surgical History:  Procedure Laterality Date   BREAST CYST ASPIRATION Left    DILATION AND CURETTAGE OF UTERUS     ENT surgery as a child      HERNIA REPAIR     umbilical as a child    infertility surgery      LAPAROSCOPIC ASSISTED VAGINAL HYSTERECTOMY N/A 08/05/2014   Procedure: LAPAROSCOPIC ASSISTED VAGINAL HYSTERECTOMY;  Surgeon: Truman Corona, MD;  Location: [facility];  Service: Gynecology;  Laterality: N/A;   SALPINGOOPHORECTOMY Bilateral 08/05/2014   Procedure: BILATERAL SALPINGO OOPHORECTOMY;  Surgeon: Truman Corona, MD;  Location: [facility];  Service: Gynecology;  Laterality: Bilateral;   Patient Active Problem List   Diagnosis Date Noted   Unilateral primary osteoarthritis, left knee 04/03/2023   Fibroids 08/05/2014   Bunion 11/12/2006   FOOT PAIN, RIGHT 11/12/2006   MUSCLE STRAIN, LEFT BUTTOCK 07/16/2006    PCP: Chrystal Lamarr RAMAN, MD   REFERRING PROVIDER: Gretta Bertrum ORN, PA-C   REFERRING DIAG: [phone] (ICD-10-CM) - Chronic pain of left knee   THERAPY DIAG:  Chronic pain of left knee  Stiffness of left knee, not elsewhere classified  Muscle weakness (generalized)  Difficulty in walking, not elsewhere  classified  Localized edema  Other muscle spasm  RATIONALE FOR EVALUATION AND TREATMENT: Rehabilitation  ONSET DATE: Chronic   NEXT MD VISIT: 02/27/2024 (post-op) with Lonni Poli, MD    SUBJECTIVE:                                                                                                                                                                                                         SUBJECTIVE STATEMENT: Pt requesting final review of HEP  today to make sure she is doing everything correctly.  EVAL: Pt reports advanced OA in her L knee for which she is planning to have TKA in early January 2026.  She requested PT now to help with her walking, esp up stairs, and make sure she is doing the right exercises to prepare for surgery.  She feels like she is now starting to have more nervy pain in her thigh. Also has been noting more of a feeling of the knee wanting to give way while walking which will stop her in tracks.  PAIN: Are you having pain? Yes: NPRS scale: 1/10  Pain location: L knee  Pain description: achy, tense when walking  Aggravating factors: walking, stairs Relieving factors: Aleve or Tylenol , elevation   Are you having pain? Yes: NPRS scale: 0/10  Pain location: R posterior knee, lower thigh and upper calf Pain description: muscle spasm/cramp Aggravating factors: recumbent bike and NuStep Relieving factors: ceasing activity, keeping toes DF   PERTINENT HISTORY:  osteoarthritis - scheduled for L TKA on 02/14/24, bunion surgery  PRECAUTIONS: None  RED FLAGS: None  WEIGHT BEARING RESTRICTIONS: No  FALLS:  Has patient fallen in last 6 months? Yes. Number of falls 1 in past 6 months, 2 in past year - both tripping over something  LIVING ENVIRONMENT: Lives with: lives with their spouse Lives in: House Stairs: Yes: Internal: 14 steps; on right going up and External: 4 steps; bilateral but cannot reach both Has following equipment at home:  Crutches  OCCUPATION: FT - Marketing - mostly deskwork  PLOF: Independent and Leisure: walking, riding bike, exercise 3-4x/wk   PATIENT GOALS: To get stronger before TKA surgery.   OBJECTIVE: (objective measures completed at initial evaluation unless otherwise dated)  DIAGNOSTIC FINDINGS:  11/11/23 - XR L knee: Left knee: Tricompartmental arthritis.  No acute fractures.  Near  bone-on-bone medial compartment.  Severe patellofemoral changes with large  osteophytes off the superior and inferior inferior poles.  Knee is well  located.  No acute fractures.   PATIENT SURVEYS:  LEFS  Extreme difficulty/unable (0), Quite a bit of difficulty (1), Moderate difficulty (2), Little difficulty (3), No difficulty (4) Survey date:  11/27/23 01/22/24  Any of your usual work, housework or school activities 4 4  2. Usual hobbies, recreational or sporting activities 4 3  3. Getting into/out of the bath 4 4  4. Walking between rooms 4 4  5. Putting on socks/shoes 3 4  6. Squatting  3 3  7. Lifting an object, like a bag of groceries from the floor 3 4  8. Performing light activities around your home 4 4  9. Performing heavy activities around your home 3 4  10. Getting into/out of a car 4 4  11. Walking 2 blocks 3 4  12. Walking 1 mile 3 3  13. Going up/down 10 stairs (1 flight) 3 4  14. Standing for 1 hour 3 4  15.  sitting for 1 hour 3 4  16. Running on even ground 2 3  17. Running on uneven ground 2 2  18. Making sharp turns while running fast 0 2  19. Hopping  3 3  20. Rolling over in bed 4 4  Score total:  62 / 80 = 77.5 % 71 / 80 = 88.8 %  Functional limitation: Minimal Minimal functional limitation or normal function    COGNITION: Overall cognitive status: Within functional limits for tasks assessed    SENSATION: WFL  EDEMA:  Circumferential:   R: mid patella - 39.0 cm L: mid patella - 40.5 cm  POSTURE:  L knee slightly flexed in stance  PALPATION: Limited patellar  mobility L>R  Increased muscle tension in L lateral quad with TPs mid VL  MUSCLE LENGTH: Hamstrings: mod tight L, mild tight R ITB: mod tight L, mild tight R Piriformis: mod tight B Hip flexors: mod/severe tight L, mod tight R Quads: mod/severe tight L, mod tight R Heelcord: WFL  LOWER EXTREMITY ROM:  A/PROM Right eval Left eval L 12/25/23 L 01/17/24 L 01/22/24  Knee flexion 139 106 123 124 128  Knee extension 0 / -2 hyperextension when supported 8 LAQ / 3 supported 6 LAQ/ supported 3 3 LAQ/ supported 0 1 LAQ / supported 0  (Blank rows = not tested)  LOWER EXTREMITY MMT:  MMT Right eval Left eval R 01/15/24 L 01/15/24  Hip flexion 4+ 4 5 5   Hip extension 4+ 4+ 5 5  Hip abduction 5 4+ 5 5  Hip adduction 4+ 4 5 5   Hip internal rotation 5 4+ 5 5  Hip external rotation 4+ 4 5 5   Knee flexion 5 4+ 5 5  Knee extension 5 4+ 5 5  Ankle dorsiflexion 4+ 4+ 5 5  Ankle plantarflexion      Ankle inversion      Ankle eversion       (Blank rows = not tested)  FUNCTIONAL TESTS: (12/10/23) 5 times sit to stand: 11.56 sec  Timed up and go (TUG): 7.31 sec  10 meter walk test: 6.34 sec  Gait speed: 5.17 ft/sec  GAIT: Distance walked: Clinic distances Assistive device utilized: None Level of assistance: Complete Independence Gait pattern: decreased stance time- Left, knee flexed in stance- Left, and antalgic   TODAY'S TREATMENT:   01/22/2024  THERAPEUTIC EXERCISE: To improve strength, endurance, ROM, and flexibility.  Demonstration, verbal and tactile cues throughout for technique. NuStep - L6 x 6' (UE/LE) Standing L quad stretch with strap x 30 Standing L hip flexor/quad stretch with foot elevated behind on treatment table 2 x 30  PHYSICAL PERFORMANCE TEST or MEASUREMENT: Dynamic Gait Index  Level Surface Normal   Change in Gait Speed Normal   Gait with Horizontal Head Turns Normal   Gait with Vertical Head Turns Normal   Gait and Pivot Turn Normal   Step Over Obstacle  Normal   Step Around Obstacles Normal   Steps Normal   Total Score 24      SELF CARE: Provided education on plan for HEP until TKA surgery and well as expected post-op rehab. Verbally reviewed remainder of current HEP as well as post-op HEP, clarifying recommended frequency for ongoing performance to maintain gains achieved with PT and prevent future decline in function.  Reviewed up with the good, down with the bad stair approach for stair negotiation post-op. Pt declined need for further review of use of DME/walker.  THERAPEUTIC ACTIVITIES: To improve functional performance.  Demonstration, verbal and tactile cues throughout for technique. L knee ROM assessment Goal assessment LEFS: 71 / 80 = 88.8 %   01/17/2024  THERAPEUTIC EXERCISE: To improve strength, endurance, ROM, and flexibility.    NuStep - L6 x 6' (UE/LE)  SELF CARE: Provided education on post-surgical exercises, on strategies for edema management, and for HEP review including recommended frequency for home performance.  Identified and provided instruction in typical immediate post-op TKA exercises including antithrombotic exercises and early knee ROM exercises with recommended frequency for post-op performance:  supine ankle pumps in elevation on pillows  - 3-5 x daily - 7 x weekly - 2 sets - 10 reps - 3 sec hold quad setting and stretching  - 3-5 x daily - 7 x weekly - 2 sets - 10 reps - 3-5 sec hold gluteal sets  - 3-5 x daily - 7 x weekly - 2 sets - 10 reps - 3 sec hold supine heel slide with strap  - 3 x daily - 7 x weekly - 2 sets - 10 reps - 3 sec hold supine hip abduction  - 3 x daily - 7 x weekly - 2 sets - 10 reps - 3 sec hold active straight leg raise with quad set  - 3 x daily - 7 x weekly - 2 sets - 10 reps - 3-5 sec hold seated heel slide  - 3 x daily - 7 x weekly - 2 sets - 10 reps - 3 sec hold seated long arc quad with ankle weight  - 3 x daily - 7 x weekly - 2 sets - 10 reps - 3 sec hold Verbally reviewed  remainder of current HEP clarifying recommended frequency for ongoing performance to maintain gains achieved with PT and prevent future decline in function.  Answered patient's questions regarding use of ice post-op as well as compression devices vs compression stockings  NEUROMUSCULAR RE-EDUCATION: To improve balance, coordination, kinesthesia, posture, proprioception, and reduce fall risk. SLS on blue foam oval + GTB 4 way hip SLR x 10 B LE, intermittent UE support on back of chair for balance   01/15/2024  THERAPEUTIC EXERCISE: To improve strength, endurance, ROM, and flexibility.   Rec Bike - L2 x 6' LE MMT Assessed stairs- WFL but uses HR for balance  NEUROMUSCULAR RE-EDUCATION: To improve coordination, kinesthesia, posture, and proprioception.  Toe tap + SLS with colored dots BLE 4x all the way around BOSU step ups BLE x 10 with support Standing balance on BOSU ball intermittent after each step up with above exercise  Squats on upside down BOSU with support x 10 Tandem gait on balance beam 3x fwd/back Sidestep EC on balance beam 3x back/forth SLS R/L on ground x 30'; 2x30' on airex   12/27/2023  THERAPEUTIC EXERCISE: To improve strength, endurance, ROM, and flexibility.   Rec Bike - L2 x 6'  SELF CARE:  Provided education on post-surgical POC and safety with use of RW and SPC for assistive device(s) post-op. Provided instruction in height adjustment for RW and cane as well as proper sequencing and gait pattern with respective AD for immediate post-op period. Informed patient that needed DME typically provided prior to discharge from hospital, however she can inquire with MD for order if she wanted to work on obtaining DME ahead of time.  GAIT TRAINING: To improve safety with RW and SPC. 1' with RW/2WW demonstrating step-to pattern for immediate post-op period while nerve block still active progressing to step-through pattern as sensation returns, providing cues for posture and  RW proximity with step placement between rear wheels of RW. Stair training up/down 1 flight with reciprocal pattern as tolerated in pre-op period as well as step-to pattern up with the good, down with the bad for immediate post-op mobility.  NEUROMUSCULAR RE-EDUCATION: To improve balance, coordination, kinesthesia, posture, proprioception, and reduce fall risk. L SLS + slight knee flexion & 5-way tap to colored dots x 10  THERAPEUTIC ACTIVITIES: To improve functional performance.  Demonstration, verbal and tactile cues throughout for technique. L 6  fwd step-up + R high knee 2 x 10   12/25/23 THERAPEUTIC EXERCISE: To improve strength, endurance, ROM, and flexibility.  Demonstration, verbal and tactile cues throughout for technique.  Bike L2x25min Heel drop calf stretch from step 2x30' Seated HS stretch hip hinge 2x30' BLE HEP review and clarification on which exercises to perform daily, every other day, etc THERAPEUTIC ACTIVITIES: To improve functional performance.  Demonstration, verbal and tactile cues throughout for technique. L black TB TKE x 20 L step ups 6 x 20 L lateral step ups 6 x 20 L step downs with toe tap to floor 6' x 20 NEUROMUSCULAR RE-EDUCATION: To improve balance, coordination, kinesthesia, posture, proprioception, and reduce fall risk. R/L SLS + slight knee flexion & 5-way tap to colored dots x 10 standing on foam   12/20/2023  THERAPEUTIC EXERCISE: To improve strength, endurance, ROM, and flexibility.  Demonstration, verbal and tactile cues throughout for technique.  NuStep - L6 x 5' (UE/LE) - time limited by sensation of impending cramp in R posterior leg Calf stretches: Gastroc - runners position x 30 on R Gastroc - toes elevated on 2 block 2 x 30 B - pt noting best stretch Soleus - toes elevated on 2 block 2 x 30 B Gastroc - negative heel off edge of 4 step x 30 B  THERAPEUTIC ACTIVITIES: To improve functional performance.  Demonstration, verbal and  tactile cues throughout for technique. L 6 fwd step-up 2 x 10, 2nd set with GTB TKE Standing L GTB TKE 3 x 10 L 6 lateral step-up 2 x 10 L 6 lateral eccentric step-down with light heel tap to floor 2 x 10 L 6 fwd eccentric step-down with light toe tap to floor 2 x 10  NEUROMUSCULAR RE-EDUCATION: To improve balance, coordination, kinesthesia, posture, proprioception, and reduce fall risk. L SLS + slight knee flexion & 5-way tap to colored dots x 10   12/12/2023  THERAPEUTIC EXERCISE: To improve strength, endurance, ROM, and flexibility.  Demonstration, verbal and tactile cues throughout for technique.  Rec Bike - L2 x 4' (seat #5) - stopped early due to cramping in R HS Nustep L6x65min- no cramping  Standing L ITB stretch at counter x 30'  MANUAL THERAPY: To promote normalized muscle tension, improve joint mobility and/or for pain modulation  Patellar mobs medial and superior glides STM to R adductors, lateral quads  NEUROMUSCULAR RE-EDUCATION: To improve coordination, kinesthesia, posture, and proprioception Standing 4 way hip RTB anchored x 10 BLE Wall squats 4x10' just above 90 deg   12/10/2023  THERAPEUTIC EXERCISE: To improve strength, endurance, ROM, and flexibility.  Demonstration, verbal and tactile cues throughout for technique.  Rec Bike - L3 x 4' (seat #5) - stopped early due to cramping in R HS Seated HS stretch with strap + DF lift for calf stretch 3 x 30 bil Standing quad stretch with strap 3 x 30 L 1/2 kneel lunge hip flexor stretch 2 x 30 Standing hip flexor stretch with lower leg supported on chair x 30 Seated lunge position hip flexor stretch x 30 Seated L knee flexion/extension heel slides with foot on sliding disc x 10 Seated L LAQ 3# 2 x 10 Hooklying L quad set + SLR 3# 2 x 10 L quad set with towel roll under heel 10 x 5 Supine L heel slide with strap to increase L knee flexion x 10 - patient noting difficulty initiating straightening of knee after  full flexion   11/27/2023 - Eval SELF CARE:  Reviewed  eval findings and role of PT in addressing identified deficits as well as instruction in initial HEP (see below).    PATIENT EDUCATION:  Education details: HEP review and recommended frequency for ongoing HEP at discharge to prevent loss of gains achieved with PT  Person educated: Patient Education method: Explanation, Demonstration, and Verbal cues Education comprehension: verbalized understanding, returned demonstration, and verbal cues required  HOME EXERCISE PROGRAM: *Pt using MedBridgeGO app.  Access Code: [email or web address] URL: [email or web address] Date: 01/17/2024 Prepared by: Elijah Hidden  Exercises - Supine Ankle Pumps in Elevation on Pillows  - 3-5 x daily - 7 x weekly - 2 sets - 10 reps - 3 sec hold - Quad Setting and Stretching  - 3-5 x daily - 7 x weekly - 2 sets - 10 reps - 3-5 sec hold - Gluteal Sets  - 3-5 x daily - 7 x weekly - 2 sets - 10 reps - 3 sec hold - Supine Heel Slide with Strap  - 3 x daily - 7 x weekly - 2 sets - 10 reps - 3 sec hold - Supine Hip Abduction  - 3 x daily - 7 x weekly - 2 sets - 10 reps - 3 sec hold - Active Straight Leg Raise with Quad Set  - 3 x daily - 7 x weekly - 2 sets - 10 reps - 3-5 sec hold - Seated Heel Slide  - 3 x daily - 7 x weekly - 2 sets - 10 reps - 3 sec hold - Seated Long Arc Quad with Ankle Weight  - 3 x daily - 7 x weekly - 2 sets - 10 reps - 3 sec hold - Gastroc Stretch on Book  - 2 x daily - 7 x weekly - 3 reps - 30 sec hold - Seated Hamstring Stretch with Strap  - 2 x daily - 7 x weekly - 3 reps - 30 sec hold - Standing Quad Stretch with Strap  - 2 x daily - 7 x weekly - 3 reps - 30 sec hold - Standing Hip Flexor Stretch on Chair  - 2 x daily - 7 x weekly - 3 reps - 30 sec hold - Standing Hip Flexion with Anchored Resistance  - 1 x daily - 3 x weekly - 3 sets - 10 reps - Standing Hip Adduction with Anchored Resistance  - 1 x daily - 3 x weekly - 3 sets - 10  reps - Standing Hip Extension with Anchored Resistance  - 1 x daily - 3 x weekly - 2 sets - 10 reps - 3 sec hold - Standing Hip Abduction with Anchored Resistance  - 1 x daily - 3 x weekly - 3 sets - 10 reps - Standing Terminal Knee Extension with Resistance  - 1 x daily - 7 x weekly - 2 sets - 10 reps - 5 sec hold - Single Leg Stance on Foam Pad  - 1 x daily - 3 x weekly - 2 sets - 2 reps - 30 seconds hold - Single Leg Stance with 3-Way Kick on Foam  - 1 x daily - 3 x weekly - 2 sets - 10 reps   ASSESSMENT:  CLINICAL IMPRESSION: DGI assessment completed with patient scoring 24/24.  HEP reviewed, verbally clarifying immediate post-op TKA exercise vs ongoing current HEP exercises to maintain recent gains with PT until surgery.  Reviewed quad and hip flexor stretches at patient request, with remainder of exercises only verbally reviewed.  L knee AROM now  1-128 with patient able to achieve 0 extension with L LE supported.  All PT goals now met and Monzerat feels ready to transition to her HEP until her TKA surgery on 02/14/2024, therefore will proceed with discharge from physical therapy for this episode.    EVAL: ALEESHA RINGSTAD is a 68 y.o. female who was referred to physical therapy for evaluation and treatment for chronic L knee pain secondary to advanced L knee osteoarthritis.  She is planning to undergo L TKA in early January 2026 and requested PT to help her prepare for surgery.   Patient reports chronic L knee pain, worse with walking and climbing stairs.  Patient has deficits in L knee ROM, L>R LE flexibility, L>R strength, abnormal posture with L knee slightly flexed in stance, and TTP with abnormal muscle tension and lateral quads and hamstrings which are interfering with ADLs and are impacting quality of life.  On LEFS patient scored 62/80 demonstrating minimal functional limitation.  Nirvi will benefit from skilled PT to address above deficits to improve mobility and activity tolerance with  decreased pain interference.  OBJECTIVE IMPAIRMENTS: Abnormal gait, decreased activity tolerance, decreased balance, decreased knowledge of use of DME, decreased mobility, difficulty walking, decreased ROM, decreased strength, increased edema, increased fascial restrictions, impaired perceived functional ability, increased muscle spasms, impaired flexibility, postural dysfunction, and pain.   ACTIVITY LIMITATIONS: carrying, lifting, sitting, standing, squatting, sleeping, stairs, transfers, and locomotion level  PARTICIPATION LIMITATIONS: cleaning and community activity  PERSONAL FACTORS: Past/current experiences, Time since onset of injury/illness/exacerbation, and 1-2 comorbidities: osteoarthritis, bunion surgery are also affecting patient's functional outcome.   REHAB POTENTIAL: Excellent  CLINICAL DECISION MAKING: Evolving/moderate complexity  EVALUATION COMPLEXITY: Moderate   GOALS: Goals reviewed with patient? Yes  SHORT TERM GOALS: Target date: 12/25/2023  Patient will be independent with initial HEP. Baseline: Initial HEP provided eval 12/10/23 - HEP reviewed and updated 12/20/23 - HEP reviewed and updated Goal status: MET - 12/27/23  2.  Patient will report at least 10-25% improvement in L proximal LE pain to improve QOL. Baseline: 3/10 Goal status: MET - 01/15/24  3.  Patient will improve 5x STS time to </= 15 seconds to demonstrate improved functional strength and transfer efficiency. Baseline: TBA Goal status: MET - 12/10/23 - 11.56 sec   LONG TERM GOALS: Target date: 01/22/2024   Patient will be independent with advanced/ongoing HEP to improve outcomes and carryover.  Baseline:  12/27/23 - in progress 01/17/24 - HEP reviewed, separating out immediate post-op TKA exercise vs ongoing current HEP exercises to maintain recent gains with PT until surgery Goal status: MET - 01/22/24  2.  Patient will report at least 25-50% improvement in L knee/proximal LE pain to  improve QOL. Baseline: 3/10 Goal status: MET - 01/17/24 - 70% improvement in pain on average  3.  Patient will demonstrate improved L knee AROM to >/= 3-120 deg to allow for normal gait and stair mechanics. Baseline: Refer to above LE ROM table 12/25/23 - L knee AROM 3-123 (Goal met) Goal status: MET - 01/17/24 - L knee AROM 3-124, extension to 0 when LE supported  4.  Patient will demonstrate improved B LE strength to >/= 4+/5 for improved stability and ease of mobility. Baseline: Refer to above LE MMT table Goal status: MET - 01/15/24  5.  Patient will be able to demonstrate proper gait pattern with RW to prepare for post-op mobility following TKA.  Baseline: currently not using AD 12/27/23 - initial training provided Goal status: MET -  01/22/24  6. Patient will be able to ascend/descend stairs with 1 HR and step-to pattern safely to access home and community after TKA.  Baseline:  Goal status: MET - 01/15/24  7.  Patient will report >/= 67/80 on LEFS to demonstrate improved functional ability. Baseline: 62 / 80 = 77.5 % Goal status: MET - 01/22/24 - 71 / 80 = 88.8 %  8.  Patient will demonstrate at least 19/24 on DGI to decrease risk of falls. Baseline:  Goal status: MET - 01/22/24 - 24/24  9.  Patient will verbalize understanding of expected post-op rehab program including initial post-op HEP. Baseline:  Goal status: MET - 01/22/24    PLAN:  PT FREQUENCY: 1-2x/week  PT DURATION: 6 weeks  PLANNED INTERVENTIONS: 02835- PT Re-evaluation, 97750- Physical Performance Testing, 97110-Therapeutic exercises, 97530- Therapeutic activity, [id number]- Neuromuscular re-education, 97535- Self Care, 02859- Manual therapy, [id number]- Gait training, [phone]- Aquatic Therapy, [phone]- Electrical stimulation (unattended), [id number]- Vasopneumatic device, 97035- Ultrasound, 02966- Ionotophoresis 4mg /ml Dexamethasone , Patient/Family education, Balance training, Stair training, Taping, Joint mobilization,  DME instructions, Cryotherapy, and Moist heat  PLAN FOR NEXT SESSION: transition to HEP + DC from PT    PHYSICAL THERAPY DISCHARGE SUMMARY  Visits from Start of Care: 9  Current functional level related to goals / functional outcomes: Refer to above clinical impression and goal assessment.    Remaining deficits: None.   Education / Equipment: HEP Expected post-op rehab POC including proper use of DME and initial post-op HEP   Patient agrees to discharge. Patient goals were met. Patient is being discharged due to meeting the stated rehab goals.  Elijah CHRISTELLA Hidden, PT 01/22/2024, 9:56 AM

## 2024-01-31 NOTE — Progress Notes (Signed)
 Sent message, via epic in basket, requesting orders in epic from Careers adviser.

## 2024-02-04 ENCOUNTER — Other Ambulatory Visit: Payer: Self-pay | Admitting: Physician Assistant

## 2024-02-04 DIAGNOSIS — Z01818 Encounter for other preprocedural examination: Secondary | ICD-10-CM

## 2024-02-05 NOTE — Progress Notes (Addendum)
 Anesthesia Review:  PCP: Minerva Rotunda  Cardiologist :  Mona Orren Lucien DORISTINE Telephone visit 01/20/24 for preop   PPM/ ICD: Device Orders: Rep Notified:  Chest x-ray : EKG :06/07/23  CT Card-2024  Echo : Stress test: Cardiac Cath :   Activity level: can do a flight of stairs without difficudtly  Sleep Study/ CPAP : none  Fasting Blood Sugar :      / Checks Blood Sugar -- times a day:    Blood Thinner/ Instructions /Last Dose: ASA / Instructions/ Last Dose :    81 mg aspirin - pt to ask surgeon office when she calls on 02/10/24.     Pt was 5 minutes late for preop appt.  PT came with maskl and tissue in hand.  PT sounds nasily and has a runny nose.  PT has slight cough. Identified pt and had her sign consent for surgery.  Asked travel screening question  Pt told me symptoms started on 02/08/2024 and that she had a cold 3 weeks ago. PT has not been tested and has not been seen by MD or facility.  Informed pt that labs could be done DOS and that I would send her home to do a phone call appt.  PT voiced understanding.  PT given hibiclens, Ensure presurgery drink and preop instructons.  Pt aware to call surgeon at end of phone call and let him be aware of symptoms.  PT was informed that respiratory synmptoms can delay surgery for 2 weeks to a month.  PT voiced understanding.  Reviewed med hx and preop instrucgtions vis phone and completed.

## 2024-02-05 NOTE — Patient Instructions (Signed)
 SURGICAL WAITING ROOM VISITATION  Patients having surgery or a procedure may have no more than 2 support people in the waiting area - these visitors may rotate.    Children ages 75 and under will not be able to visit patients in [facility] under most circumstances.   Visitors with respiratory illnesses are discouraged from visiting and should remain at home.  If the patient needs to stay at the hospital during part of their recovery, the visitor guidelines for inpatient rooms apply. Pre-op nurse will coordinate an appropriate time for 1 support person to accompany patient in pre-op.  This support person may not rotate.    Please refer to the [facility] website for the visitor guidelines for Inpatients (after your surgery is over and you are in a regular room).       Your procedure is scheduled on:  02/14/2024    Report to [facility] Main Entrance    Report to admitting at   0600AM   Call this number if you have problems the morning of surgery [phone]   Do not eat food :After Midnight.   After Midnight you may have the following liquids until _  0530_____ AM/ DAY OF SURGERY  Water Non-Citrus Juices (without pulp, NO RED-Apple, White grape, White cranberry) Black Coffee (NO MILK/CREAM OR CREAMERS, sugar ok)  Clear Tea (NO MILK/CREAM OR CREAMERS, sugar ok) regular and decaf                             Plain Jell-O (NO RED)                                           Fruit ices (not with fruit pulp, NO RED)                                     Popsicles (NO RED)                                                               Sports drinks like Gatorade (NO RED)                     The day of surgery:  Drink ONE (1) Pre-Surgery Clear Ensure or G2 at 0530 AM the morning of surgery. Drink in one sitting. Do not sip.  This drink was given to you during your hospital  pre-op appointment visit. Nothing else to drink after completing the  Pre-Surgery Clear Ensure  or G2.          If you have questions, please contact your surgeons office.       Oral Hygiene is also important to reduce your risk of infection.                                    Remember - BRUSH YOUR TEETH THE MORNING OF SURGERY WITH YOUR REGULAR TOOTHPASTE  DENTURES WILL BE REMOVED PRIOR TO SURGERY PLEASE DO NOT  APPLY Poly grip OR ADHESIVES!!!   Do NOT smoke after Midnight   Stop all vitamins and herbal supplements 7 days before surgery.   Take these medicines the morning of surgery with A SIP OF WATER:  amlodipine    DO NOT TAKE ANY ORAL DIABETIC MEDICATIONS DAY OF YOUR SURGERY  Bring CPAP mask and tubing day of surgery.                              You may not have any metal on your body including hair pins, jewelry, and body piercing             Do not wear make-up, lotions, powders, perfumes/cologne, or deodorant  Do not wear nail polish including gel and S&S, artificial/acrylic nails, or any other type of covering on natural nails including finger and toenails. If you have artificial nails, gel coating, etc. that needs to be removed by a nail salon please have this removed prior to surgery or surgery may need to be canceled/ delayed if the surgeon/ anesthesia feels like they are unable to be safely monitored.   Do not shave  48 hours prior to surgery.               Men may shave face and neck.   Do not bring valuables to the hospital. [place] IS NOT             RESPONSIBLE   FOR VALUABLES.   Contacts, glasses, dentures or bridgework may not be worn into surgery.   Bring small overnight bag day of surgery.   DO NOT BRING YOUR HOME MEDICATIONS TO THE HOSPITAL. PHARMACY WILL DISPENSE MEDICATIONS LISTED ON YOUR MEDICATION LIST TO YOU DURING YOUR ADMISSION IN THE HOSPITAL!    Patients discharged on the day of surgery will not be allowed to drive home.  Someone NEEDS to stay with you for the first 24 hours after anesthesia.   Special Instructions: Bring a copy of  your healthcare power of attorney and living will documents the day of surgery if you haven't scanned them before.              Please read over the following fact sheets you were given: IF YOU HAVE QUESTIONS ABOUT YOUR PRE-OP INSTRUCTIONS PLEASE CALL [phone].   If you received a COVID test during your pre-op visit  it is requested that you wear a mask when out in public, stay away from anyone that may not be feeling well and notify your surgeon if you develop symptoms. If you test positive for Covid or have been in contact with anyone that has tested positive in the last 10 days please notify you surgeon.      Pre-operative 4 CHG Bath Instructions   You can play a key role in reducing the risk of infection after surgery. Your skin needs to be as free of germs as possible. You can reduce the number of germs on your skin by washing with CHG (chlorhexidine  gluconate) soap before surgery. CHG is an antiseptic soap that kills germs and continues to kill germs even after washing.   DO NOT use if you have an allergy to chlorhexidine /CHG or antibacterial soaps. If your skin becomes reddened or irritated, stop using the CHG and notify one of our RNs at [phone].   Please shower with the CHG soap starting 4 days before surgery using the following schedule:     Please keep  in mind the following:  DO NOT shave, including legs and underarms, starting the day of your first shower.   You may shave your face at any point before/day of surgery.  Place clean sheets on your bed the day you start using CHG soap. Use a clean washcloth (not used since being washed) for each shower. DO NOT sleep with pets once you start using the CHG.   CHG Shower Instructions:  If you choose to wash your hair and private area, wash first with your normal shampoo/soap.  After you use shampoo/soap, rinse your hair and body thoroughly to remove shampoo/soap residue.  Turn the water OFF and apply about 3 tablespoons (45 ml)  of CHG soap to a CLEAN washcloth.  Apply CHG soap ONLY FROM YOUR NECK DOWN TO YOUR TOES (washing for 3-5 minutes)  DO NOT use CHG soap on face, private areas, open wounds, or sores.  Pay special attention to the area where your surgery is being performed.  If you are having back surgery, having someone wash your back for you may be helpful. Wait 2 minutes after CHG soap is applied, then you may rinse off the CHG soap.  Pat dry with a clean towel  Put on clean clothes/pajamas   If you choose to wear lotion, please use ONLY the CHG-compatible lotions on the back of this paper.     Additional instructions for the day of surgery: DO NOT APPLY any lotions, deodorants, cologne, or perfumes.   Put on clean/comfortable clothes.  Brush your teeth.  Ask your nurse before applying any prescription medications to the skin.      CHG Compatible Lotions   Aveeno Moisturizing lotion  Cetaphil Moisturizing Cream  Cetaphil Moisturizing Lotion  Clairol Herbal Essence Moisturizing Lotion, Dry Skin  Clairol Herbal Essence Moisturizing Lotion, Extra Dry Skin  Clairol Herbal Essence Moisturizing Lotion, Normal Skin  Curel Age Defying Therapeutic Moisturizing Lotion with Alpha Hydroxy  Curel Extreme Care Body Lotion  Curel Soothing Hands Moisturizing Hand Lotion  Curel Therapeutic Moisturizing Cream, Fragrance-Free  Curel Therapeutic Moisturizing Lotion, Fragrance-Free  Curel Therapeutic Moisturizing Lotion, Original Formula  Eucerin Daily Replenishing Lotion  Eucerin Dry Skin Therapy Plus Alpha Hydroxy Crme  Eucerin Dry Skin Therapy Plus Alpha Hydroxy Lotion  Eucerin Original Crme  Eucerin Original Lotion  Eucerin Plus Crme Eucerin Plus Lotion  Eucerin TriLipid Replenishing Lotion  Keri Anti-Bacterial Hand Lotion  Keri Deep Conditioning Original Lotion Dry Skin Formula Softly Scented  Keri Deep Conditioning Original Lotion, Fragrance Free Sensitive Skin Formula  Keri Lotion Fast Absorbing  Fragrance Free Sensitive Skin Formula  Keri Lotion Fast Absorbing Softly Scented Dry Skin Formula  Keri Original Lotion  Keri Skin Renewal Lotion Keri Silky Smooth Lotion  Keri Silky Smooth Sensitive Skin Lotion  Nivea Body Creamy Conditioning Oil  Nivea Body Extra Enriched Teacher, Adult Education Moisturizing Lotion Nivea Crme  Nivea Skin Firming Lotion  NutraDerm 30 Skin Lotion  NutraDerm Skin Lotion  NutraDerm Therapeutic Skin Cream  NutraDerm Therapeutic Skin Lotion  ProShield Protective Hand Cream  Provon moisturizing lotion

## 2024-02-10 ENCOUNTER — Encounter (HOSPITAL_COMMUNITY): Payer: Self-pay | Admitting: Medical

## 2024-02-10 ENCOUNTER — Encounter (HOSPITAL_COMMUNITY): Payer: Self-pay

## 2024-02-10 ENCOUNTER — Other Ambulatory Visit: Payer: Self-pay

## 2024-02-10 ENCOUNTER — Encounter (HOSPITAL_COMMUNITY)
Admission: RE | Admit: 2024-02-10 | Discharge: 2024-02-10 | Disposition: A | Discharge status: Home / Self Care | Source: Ambulatory Visit | Attending: Orthopaedic Surgery | Admitting: Orthopaedic Surgery

## 2024-02-10 ENCOUNTER — Telehealth: Payer: Self-pay | Admitting: *Deleted

## 2024-02-10 DIAGNOSIS — Z01812 Encounter for preprocedural laboratory examination: Secondary | ICD-10-CM | POA: Diagnosis present

## 2024-02-10 DIAGNOSIS — Z01818 Encounter for other preprocedural examination: Secondary | ICD-10-CM

## 2024-02-10 HISTORY — DX: Essential (primary) hypertension: I10

## 2024-02-10 HISTORY — DX: Other complications of anesthesia, initial encounter: T88.59XA

## 2024-02-10 HISTORY — DX: Unspecified osteoarthritis, unspecified site: M19.90

## 2024-02-10 NOTE — Telephone Encounter (Signed)
 Patient called back this afternoon and noted that she is positive for Covid today when tested by her PCP. Left total knee scheduled for [facility] this Friday, 02/14/24. I informed that I would pass along information and that Sherrie would get her back on the schedule when possible. Thank you.

## 2024-02-14 ENCOUNTER — Encounter (HOSPITAL_COMMUNITY): Admission: RE | Payer: Self-pay | Source: Home / Self Care

## 2024-02-14 ENCOUNTER — Ambulatory Visit (HOSPITAL_COMMUNITY): Admission: RE | Admit: 2024-02-14 | Source: Home / Self Care | Admitting: Orthopaedic Surgery

## 2024-02-14 SURGERY — ARTHROPLASTY, KNEE, TOTAL
Anesthesia: Spinal | Site: Knee | Laterality: Left

## 2024-02-27 ENCOUNTER — Encounter: Admitting: Orthopaedic Surgery

## 2024-03-13 NOTE — Progress Notes (Signed)
 Sent message, via epic in basket, requesting orders in epic from Careers adviser.

## 2024-03-20 ENCOUNTER — Ambulatory Visit: Admitting: Internal Medicine

## 2024-03-20 ENCOUNTER — Encounter (HOSPITAL_COMMUNITY): Admission: RE | Admit: 2024-03-20

## 2024-04-02 ENCOUNTER — Ambulatory Visit (HOSPITAL_COMMUNITY): Admit: 2024-04-02 | Admitting: Orthopaedic Surgery

## 2024-04-02 SURGERY — ARTHROPLASTY, KNEE, TOTAL
Anesthesia: Spinal | Site: Knee | Laterality: Left

## 2024-04-09 ENCOUNTER — Encounter: Admitting: Orthopaedic Surgery

## 2024-04-16 ENCOUNTER — Encounter: Admitting: Orthopaedic Surgery
